# Patient Record
Sex: Female | Born: 1960 | Race: White | Hispanic: No | Marital: Married | Smoking: Never smoker
Health system: Southern US, Community
[De-identification: ages and names within clinical notes are randomized; demographics above are authoritative.]

## PROBLEM LIST (undated history)

## (undated) DIAGNOSIS — C801 Malignant (primary) neoplasm, unspecified: Secondary | ICD-10-CM

## (undated) DIAGNOSIS — I10 Essential (primary) hypertension: Secondary | ICD-10-CM

## (undated) HISTORY — DX: Essential (primary) hypertension: I10

## (undated) HISTORY — PX: CYST REMOVAL TRUNK: SHX6283

## (undated) HISTORY — DX: Malignant (primary) neoplasm, unspecified: C80.1

---

## 2000-04-12 HISTORY — PX: MOHS SURGERY: SUR867

## 2000-04-12 HISTORY — PX: BREAST CYST ASPIRATION: SHX578

## 2007-10-05 ENCOUNTER — Ambulatory Visit: Payer: Self-pay | Admitting: Obstetrics and Gynecology

## 2007-10-20 ENCOUNTER — Ambulatory Visit: Payer: Self-pay | Admitting: Obstetrics and Gynecology

## 2008-04-01 ENCOUNTER — Ambulatory Visit: Payer: Self-pay | Admitting: Obstetrics and Gynecology

## 2008-04-22 ENCOUNTER — Ambulatory Visit: Payer: Self-pay | Admitting: Obstetrics and Gynecology

## 2014-11-13 ENCOUNTER — Encounter: Payer: Self-pay | Admitting: Unknown Physician Specialty

## 2014-11-13 ENCOUNTER — Ambulatory Visit (INDEPENDENT_AMBULATORY_CARE_PROVIDER_SITE_OTHER): Payer: BLUE CROSS/BLUE SHIELD | Admitting: Unknown Physician Specialty

## 2014-11-13 VITALS — BP 176/100 | HR 85 | Temp 98.5°F | Ht 63.0 in | Wt 186.4 lb

## 2014-11-13 DIAGNOSIS — E669 Obesity, unspecified: Secondary | ICD-10-CM | POA: Insufficient documentation

## 2014-11-13 DIAGNOSIS — I10 Essential (primary) hypertension: Secondary | ICD-10-CM | POA: Diagnosis not present

## 2014-11-13 NOTE — Assessment & Plan Note (Signed)
This seems to be new problem.  Really nervous today.  Will buy a BP cuff and record readings at home.  Recheck in one month with readings.

## 2014-11-13 NOTE — Progress Notes (Signed)
   BP 176/100 mmHg  Pulse 85  Temp(Src) 98.5 F (36.9 C)  Ht 5\' 3"  (1.6 m)  Wt 186 lb 6.4 oz (84.55 kg)  BMI 33.03 kg/m2  SpO2 96%  LMP 10/16/2014 (Exact Date)   Subjective:    Patient ID: [name], female    DOB: Jan 19, 1961, 54 y.o.   MRN: [id number]  HPI: [name] is a 54 y.o. female  Chief Complaint  Patient presents with  . Establish Care   Here to establish as a patient.  Last week went for mammogram and gyn exam at [place] know what her BP is.    Relevant past medical, surgical, family and social history reviewed and updated as indicated. Interim medical history since our last visit reviewed. Allergies and medications reviewed and updated.  Review of Systems  Constitutional: Negative.   HENT: Negative.   Eyes: Negative.   Respiratory: Negative.   Cardiovascular: Negative.   Gastrointestinal: Negative.   Endocrine: Negative.   Genitourinary: Negative.   Musculoskeletal: Negative.   Skin: Negative.   Allergic/Immunologic: Negative.   Neurological: Negative.   Hematological: Negative.   Psychiatric/Behavioral: Negative.     Per HPI unless specifically indicated above     Objective:    BP 176/100 mmHg  Pulse 85  Temp(Src) 98.5 F (36.9 C)  Ht 5\' 3"  (1.6 m)  Wt 186 lb 6.4 oz (84.55 kg)  BMI 33.03 kg/m2  SpO2 96%  LMP 10/16/2014 (Exact Date)  Wt Readings from Last 3 Encounters:  11/13/14 186 lb 6.4 oz (84.55 kg)    Physical Exam  Constitutional: She is oriented to person, place, and time. She appears well-developed and well-nourished. No distress.  HENT:  Head: Normocephalic and atraumatic.  Eyes: Conjunctivae and lids are normal. Right eye exhibits no discharge. Left eye exhibits no discharge. No scleral icterus.  Cardiovascular: Normal rate and regular rhythm.   Pulmonary/Chest: Effort normal. No respiratory distress.  Abdominal: Normal appearance and bowel sounds are normal. She exhibits no distension. There is no  splenomegaly or hepatomegaly. There is no tenderness.  Musculoskeletal: Normal range of motion.  Neurological: She is alert and oriented to person, place, and time.  Skin: Skin is intact. No rash noted. No pallor.  Psychiatric: She has a normal mood and affect. Her behavior is normal. Judgment and thought content normal.    No results found for this or any previous visit.    Assessment & Plan:   Problem List Items Addressed This Visit      Unprioritized   Essential hypertension, benign - Primary    This seems to be new problem.  Really nervous today.  Will buy a BP cuff and record readings at home.  Recheck in one month with readings.        Obesity    Exercise plan discussed.  Will not exercise unless BP to goal.            Follow up plan: Return in about 4 weeks (around 12/11/2014).

## 2014-11-13 NOTE — Assessment & Plan Note (Signed)
Exercise plan discussed.  Will not exercise unless BP to goal.

## 2014-11-13 NOTE — Patient Instructions (Addendum)
Think you're too busy to work out? We have the workout for you. In minutes, high-intensity interval training (H.I.I.T.) will have you sweating, breathing hard and maximizing the health benefits of exercise without the time commitment. Best of all, it's scientifically proven to work.  What Is H.I.I.T.? SHORT WORKOUTS 101 High-intensity interval training - referred to as H.I.I.T. - is based on the idea that short bursts of strenuous exercise can have a big impact on the body. If moderate exercise - like a 20-minute jog - is good for your heart, lungs and metabolism, H.I.I.T. packs the benefits of that workout and more into a few minutes. It may sound too good to be true, but learning this exercise technique and adapting it to your life can mean saving hours at the gym. If you think you don't have time to exercise, H.I.I.T. may be the workout for you.  You can try it with any aerobic activity you like. The principles of H.I.I.T. can be applied to running, biking, stair climbing, swimming, jumping rope, rowing, even hopping or skipping. (Yes, skipping!)  The downside? Even though H.I.I.T. lasts only minutes, the workouts are tough, requiring you to push your body near its limit.  HOW INTENSE IS HIGH INTENSITY? High-intensity exercise is obviously not a casual stroll down the street, but it's not a run-till-your-lungs-pop explosion, either. Think breathless, not winded. Heart-pounding, not exploding. Legs pumping, but not uncontrolled.  You don't need any fancy heart rate monitors to do these workouts. Use cues from your body as a guide. In the middle of a high-intensity workout you should be able to say single words, but not complete whole sentences. So, if you can keep chatting to your workout partner during this workout, pump it up a few notches.  01-29-29 Training This simple program will help you make the most of a short workout by improving heart health and endurance. Try it with your favorite  cardiovascular activity. The essentials of 01-29-29 training are simple. Run, ride or perhaps row on a rowing machine gently for 30 seconds, accelerate to a moderate pace for 20 seconds, then sprint as hard as you can for 10 seconds. (It should be called 30-20-10 training, obviously, but that is not as catchy.) Repeat.  You don't even need a stopwatch to monitor the 30-, 20-, and 10-second time changes. You can just count to yourself, which seems to make the intervals pass more quickly.  Best of all? The grueling, all-out portion of the workout lasts for only 10 seconds. C'mon, you can do anything for 10 seconds, right?  Got 10 Minutes? A solitary minute of hard work buried in 10 minutes of activity can make a big difference.  The 10-Minute Workout If you like to run, bike, row or swim - just a little bit - this workout is a great option for you. Step 1 Warm up for 2 minutes Step 2 Pedal, run or swim all-out for 20 seconds. Repeat 2 more times Warm down for 3 minutes    GET STARTED To benefit the most from really, really short workouts, you need to build the habit of doing them into your hectic life. Ideally, you'll complete the workout three times a week. The best way to build that habit is to start small and be willing to tweak your schedule where you can to accommodate your new workout.  First set up a spot in your house for your workout, equipped with whatever you need to get the job done: sneakers, a  chair, a towel, etc. Then slot your workout in before you would normally shower. (You can even do it in the bathroom.) Or wake up five minutes earlier and do it first thing in the morning, so you can head off to work feeling accomplished. Or do it during your lunch hour. Run up your office's stairs or grab a private conference room for just a few minutes. Or work it into your commute. If you walk or bike to work, add some heavy intervals on the way home.  GET A BOOST FROM MUSIC Creating a  workout playlist of high-energy tunes you love will not make your workout feel easier, but it may cause you to exercise harder without even realizing it. Best of all, if you are doing a really short workout, you need only one or two great tunes to get you through. If you are willing to try something a bit different, make your own music as you exercise. Sing, hum, clap your hands, whatever you can do to jam along to your playlist. It may give you an extra boost to finish strong.  Find a song or podcast that's the length of your really, really short workout. By the time the song is over, you're done.  Excerpted from the [place] Well column http://www.nytimes.com/well/guides/really-really-short-workouts?smid=fb-nytwell&smtyp=pay   DASH Eating Plan DASH stands for "Dietary Approaches to Stop Hypertension." The DASH eating plan is a healthy eating plan that has been shown to reduce high blood pressure (hypertension). Additional health benefits may include reducing the risk of type 2 diabetes mellitus, heart disease, and stroke. The DASH eating plan may also help with weight loss. WHAT DO I NEED TO KNOW ABOUT THE DASH EATING PLAN? For the DASH eating plan, you will follow these general guidelines:  Choose foods with a percent daily value for sodium of less than 5% (as listed on the food label).  Use salt-free seasonings or herbs instead of table salt or sea salt.  Check with your health care provider or pharmacist before using salt substitutes.  Eat lower-sodium products, often labeled as "lower sodium" or "no salt added."  Eat fresh foods.  Eat more vegetables, fruits, and low-fat dairy products.  Choose whole grains. Look for the word "whole" as the first word in the ingredient list.  Choose fish and skinless chicken or [place] more often than red meat. Limit fish, poultry, and meat to 6 oz (170 g) each day.  Limit sweets, desserts, sugars, and sugary drinks.  Choose heart-healthy  fats.  Limit cheese to 1 oz (28 g) per day.  Eat more home-cooked food and less restaurant, buffet, and fast food.  Limit fried foods.  Cook foods using methods other than frying.  Limit canned vegetables. If you do use them, rinse them well to decrease the sodium.  When eating at a restaurant, ask that your food be prepared with less salt, or no salt if possible. WHAT FOODS CAN I EAT? Seek help from a dietitian for individual calorie needs. Grains Whole grain or whole wheat bread. Brown rice. Whole grain or whole wheat pasta. Quinoa, bulgur, and whole grain cereals. Low-sodium cereals. Corn or whole wheat flour tortillas. Whole grain cornbread. Whole grain crackers. Low-sodium crackers. Vegetables Fresh or frozen vegetables (raw, steamed, roasted, or grilled). Low-sodium or reduced-sodium tomato and vegetable juices. Low-sodium or reduced-sodium tomato sauce and paste. Low-sodium or reduced-sodium canned vegetables.  Fruits All fresh, canned (in natural juice), or frozen fruits. Meat and Other Protein Products Ground beef (85% or  leaner), grass-fed beef, or beef trimmed of fat. Skinless chicken or [place]. Ground chicken or [place]. Pork trimmed of fat. All fish and seafood. Eggs. Dried beans, peas, or lentils. Unsalted nuts and seeds. Unsalted canned beans. Dairy Low-fat dairy products, such as skim or 1% milk, 2% or reduced-fat cheeses, low-fat ricotta or cottage cheese, or plain low-fat yogurt. Low-sodium or reduced-sodium cheeses. Fats and Oils Tub margarines without trans fats. Light or reduced-fat mayonnaise and salad dressings (reduced sodium). Avocado. Safflower, olive, or canola oils. Natural peanut or almond butter. Other Unsalted popcorn and pretzels. The items listed above may not be a complete list of recommended foods or beverages. Contact your dietitian for more options. WHAT FOODS ARE NOT RECOMMENDED? Grains White bread. White pasta. White rice. Refined cornbread.  Bagels and croissants. Crackers that contain trans fat. Vegetables Creamed or fried vegetables. Vegetables in a cheese sauce. Regular canned vegetables. Regular canned tomato sauce and paste. Regular tomato and vegetable juices. Fruits Dried fruits. Canned fruit in light or heavy syrup. Fruit juice. Meat and Other Protein Products Fatty cuts of meat. Ribs, chicken wings, bacon, sausage, bologna, salami, chitterlings, fatback, hot dogs, bratwurst, and packaged luncheon meats. Salted nuts and seeds. Canned beans with salt. Dairy Whole or 2% milk, cream, half-and-half, and cream cheese. Whole-fat or sweetened yogurt. Full-fat cheeses or blue cheese. Nondairy creamers and whipped toppings. Processed cheese, cheese spreads, or cheese curds. Condiments Onion and garlic salt, seasoned salt, table salt, and sea salt. Canned and packaged gravies. Worcestershire sauce. Tartar sauce. Barbecue sauce. Teriyaki sauce. Soy sauce, including reduced sodium. Steak sauce. Fish sauce. Oyster sauce. Cocktail sauce. Horseradish. Ketchup and mustard. Meat flavorings and tenderizers. Bouillon cubes. Hot sauce. Tabasco sauce. Marinades. Taco seasonings. Relishes. Fats and Oils Butter, stick margarine, lard, shortening, ghee, and bacon fat. Coconut, palm kernel, or palm oils. Regular salad dressings. Other Pickles and olives. Salted popcorn and pretzels. The items listed above may not be a complete list of foods and beverages to avoid. Contact your dietitian for more information. WHERE CAN I FIND MORE INFORMATION? National Heart, Lung, and Blood Institute: travelstabloid.com Document Released: 03/18/2011 Document Revised: 08/13/2013 Document Reviewed: 01/31/2013 [facility] Patient Information 2015 [place], [place]. This information is not intended to replace advice given to you by your health care provider. Make sure you discuss any questions you have with your health care provider.   BP  goal is below 140/90.  Check at home at different times of the day.   Recheck in one month with reading.  Bring in biometric screening.  Do not start an exercise plan without your BP to goal

## 2014-11-14 NOTE — Addendum Note (Signed)
Addended by: Moss Mc on: 11/14/2014 10:34 AM   Modules accepted: Orders

## 2014-12-13 ENCOUNTER — Ambulatory Visit: Payer: BLUE CROSS/BLUE SHIELD | Admitting: Unknown Physician Specialty

## 2015-03-03 ENCOUNTER — Encounter: Payer: Self-pay | Admitting: Unknown Physician Specialty

## 2015-11-19 ENCOUNTER — Ambulatory Visit (INDEPENDENT_AMBULATORY_CARE_PROVIDER_SITE_OTHER): Payer: BLUE CROSS/BLUE SHIELD | Admitting: Family

## 2015-11-19 ENCOUNTER — Encounter (INDEPENDENT_AMBULATORY_CARE_PROVIDER_SITE_OTHER): Payer: Self-pay

## 2015-11-19 ENCOUNTER — Encounter: Payer: Self-pay | Admitting: Family

## 2015-11-19 VITALS — BP 152/98 | HR 81 | Temp 98.1°F | Wt 177.4 lb

## 2015-11-19 DIAGNOSIS — R030 Elevated blood-pressure reading, without diagnosis of hypertension: Secondary | ICD-10-CM

## 2015-11-19 DIAGNOSIS — Z0000 Encounter for general adult medical examination without abnormal findings: Secondary | ICD-10-CM | POA: Insufficient documentation

## 2015-11-19 DIAGNOSIS — IMO0001 Reserved for inherently not codable concepts without codable children: Secondary | ICD-10-CM

## 2015-11-19 NOTE — Assessment & Plan Note (Addendum)
Patient politely declines colorectal screening, clinical breast exam, hepatitis and HIV screening. I have given her information on Cologuard and patient will contact our office if she is interested in this test. Patient is due for mammogram and she would like to schedule this herself. Information given. Otherwise she follows at [place] where her last Pap smear was done .She also had basic labs done recently, due to her husband's insurance, and politely declines any further screening labs today. We discussed white coat syndrome versus essential hypertension, and patient will return for nurse's visit for BP check. Asymptomatic. We also discussed weight management and a low-cholesterol diet as her cholesterol panel revealed slightly elevated total cholesterol and LDL cholesterol. She would like to follow and treat with lifestyle modifications at this time.

## 2015-11-19 NOTE — Progress Notes (Signed)
Subjective:    Patient ID: [name], female    DOB: September 30, 1960, 55 y.o.   MRN: [id number]  CC: [name] is a 55 y.o. female who presents today for follow up.   HPI: Patient here to establish care. Has had some intermittent care at [facility], we are requesting records.   Colorectal Cancer Screening:Has never had; politely declines. Cologuard information given to patient and she will let us know if interested. Breast Cancer Screening: Due, Ordered today. Patient to schedule as preferred. Cervical Cancer Screening: Not Due. Last 2015. H/o of abnormal pap; had colposcopy which came back normal. Follows with [facility].    Immunizations       Tetanus - last 2013.  Hepatitis C screening - Candidate for; politely declined HIV Screening- Candidate for; politely declined. Labs: Declined. Exercise: Gets regular exercise.  Alcohol use: Occasional.  Smoking/tobacco use: Nonsmoker.  Regular dental exams: In need of dental exam. Wears seat belt: Yes.   Reviewed lab work the patient brought in from July of this year. Total Cholesterol 215, HDL 56,LDL 136.  Hemoglobin A1c 5.2     HISTORY:  Past Medical History:  Diagnosis Date  . Cancer ([place])    skin  . Hypertension    readings   Past Surgical History:  Procedure Laterality Date  . CYST REMOVAL TRUNK    . MOHS SURGERY  2002   Family History  Problem Relation Age of Onset  . Hypertension Mother   . Cancer Mother 87    bladder  . Hepatitis C Mother   . Cancer Father 42    bladder  . Stroke Maternal Grandmother   . Hypertension Maternal Grandmother     Allergies: Review of patient's allergies indicates not on file. No current outpatient prescriptions on file prior to visit.   No current facility-administered medications on file prior to visit.     Social History  Substance Use Topics  . Smoking status: Never Smoker  . Smokeless tobacco: Never Used  . Alcohol use 1.2 oz/week    2 Glasses of wine  per week    Review of Systems  Constitutional: Negative for chills, fever and unexpected weight change.  HENT: Negative for congestion.   Respiratory: Negative for cough.   Cardiovascular: Negative for chest pain, palpitations and leg swelling.  Gastrointestinal: Negative for nausea and vomiting.  Musculoskeletal: Negative for arthralgias and myalgias.  Skin: Negative for rash.  Neurological: Negative for headaches.  Hematological: Negative for adenopathy.  Psychiatric/Behavioral: Negative for confusion.      Objective:    BP (!) 152/98 (BP Location: Left Arm, Patient Position: Sitting, Cuff Size: Large)   Pulse 81   Temp 98.1 F (36.7 C) (Oral)   Wt 177 lb 6.4 oz (80.5 kg)   SpO2 96%   BMI 31.42 kg/m  BP Readings from Last 3 Encounters:  11/19/15 (!) 152/98  11/13/14 (!) 176/100   Wt Readings from Last 3 Encounters:  11/19/15 177 lb 6.4 oz (80.5 kg)  11/13/14 186 lb 6.4 oz (84.6 kg)    Physical Exam  Constitutional: She appears well-developed and well-nourished.  Eyes: Conjunctivae are normal.  Cardiovascular: Normal rate, regular rhythm, normal heart sounds and normal pulses.   Pulmonary/Chest: Effort normal and breath sounds normal. She has no wheezes. She has no rhonchi. She has no rales.  Neurological: She is alert.  Skin: Skin is warm and dry.  Psychiatric: She has a normal mood and affect. Her speech is normal and  behavior is normal. Thought content normal.  Vitals reviewed.      Assessment & Plan:   Problem List Items Addressed This Visit      Other   Elevated BP    Patient believes white coat syndrome. Will have nurse check BP next week. Will follow.       Routine physical examination - Primary    Patient politely declines colorectal screening, clinical breast exam, hepatitis and HIV screening. I have given her information on Cologuard and patient will contact our office if she is interested in this test. Patient is due for mammogram and she would  like to schedule this herself. Information given. Otherwise she follows at [place] where her last Pap smear was done .She also had basic labs done recently, due to her husband's insurance, and politely declines any further screening labs today. We discussed white coat syndrome versus essential hypertension, and patient will return for nurse's visit for BP check. Asymptomatic. We also discussed weight management and a low-cholesterol diet as her cholesterol panel revealed slightly elevated total cholesterol and LDL cholesterol. She would like to follow and treat with lifestyle modifications at this time.       Relevant Orders   MM DIGITAL SCREENING BILATERAL    Other Visit Diagnoses   None.      [name] does not currently have medications on file.   No orders of the defined types were placed in this encounter.   Return precautions given.   Risks, benefits, and alternatives of the medications and treatment plan prescribed today were discussed, and patient expressed understanding.   Education regarding symptom management and diagnosis given to patient on AVS.  Continue to follow with Mable Paris, FNP for routine health maintenance.   Jodi Geralds and I agreed with plan.   Mable Paris, FNP

## 2015-11-19 NOTE — Patient Instructions (Signed)
It is my pleasure meeting you today. As we discussed, I would like to watch your blood pressure more closely,  please come back in one week for nurse's visit to check blood pressure. You may make this appointment at checkout.    Health Maintenance, Female Adopting a healthy lifestyle and getting preventive care can go a long way to promote health and wellness. Talk with your health care provider about what schedule of regular examinations is right for you. This is a good chance for you to check in with your provider about disease prevention and staying healthy. In between checkups, there are plenty of things you can do on your own. Experts have done a lot of research about which lifestyle changes and preventive measures are most likely to keep you healthy. Ask your health care provider for more information. WEIGHT AND DIET  Eat a healthy diet  Be sure to include plenty of vegetables, fruits, low-fat dairy products, and lean protein.  Do not eat a lot of foods high in solid fats, added sugars, or salt.  Get regular exercise. This is one of the most important things you can do for your health.  Most adults should exercise for at least 150 minutes each week. The exercise should increase your heart rate and make you sweat (moderate-intensity exercise).  Most adults should also do strengthening exercises at least twice a week. This is in addition to the moderate-intensity exercise.  Maintain a healthy weight  Body mass index (BMI) is a measurement that can be used to identify possible weight problems. It estimates body fat based on height and weight. Your health care provider can help determine your BMI and help you achieve or maintain a healthy weight.  For females 67 years of age and older:   A BMI below 18.5 is considered underweight.  A BMI of 18.5 to 24.9 is normal.  A BMI of 25 to 29.9 is considered overweight.  A BMI of 30 and above is considered obese.  Watch levels of  cholesterol and blood lipids  You should start having your blood tested for lipids and cholesterol at 55 years of age, then have this test every 5 years.  You may need to have your cholesterol levels checked more often if:  Your lipid or cholesterol levels are high.  You are older than 55 years of age.  You are at high risk for heart disease.  CANCER SCREENING   Lung Cancer  Lung cancer screening is recommended for adults 61-31 years old who are at high risk for lung cancer because of a history of smoking.  A yearly low-dose CT scan of the lungs is recommended for people who:  Currently smoke.  Have quit within the past 15 years.  Have at least a 30-pack-year history of smoking. A pack year is smoking an average of one pack of cigarettes a day for 1 year.  Yearly screening should continue until it has been 15 years since you quit.  Yearly screening should stop if you develop a health problem that would prevent you from having lung cancer treatment.  Breast Cancer  Practice breast self-awareness. This means understanding how your breasts normally appear and feel.  It also means doing regular breast self-exams. Let your health care provider know about any changes, no matter how small.  If you are in your 20s or 30s, you should have a clinical breast exam (CBE) by a health care provider every 1-3 years as part of a regular health  exam.  If you are 40 or older, have a CBE every year. Also consider having a breast X-ray (mammogram) every year.  If you have a family history of breast cancer, talk to your health care provider about genetic screening.  If you are at high risk for breast cancer, talk to your health care provider about having an MRI and a mammogram every year.  Breast cancer gene (BRCA) assessment is recommended for women who have family members with BRCA-related cancers. BRCA-related cancers include:  Breast.  Ovarian.  Tubal.  Peritoneal  cancers.  Results of the assessment will determine the need for genetic counseling and BRCA1 and BRCA2 testing. Cervical Cancer Your health care provider may recommend that you be screened regularly for cancer of the pelvic organs (ovaries, uterus, and vagina). This screening involves a pelvic examination, including checking for microscopic changes to the surface of your cervix (Pap test). You may be encouraged to have this screening done every 3 years, beginning at age 41.  For women ages 38-65, health care providers may recommend pelvic exams and Pap testing every 3 years, or they may recommend the Pap and pelvic exam, combined with testing for human papilloma virus (HPV), every 5 years. Some types of HPV increase your risk of cervical cancer. Testing for HPV may also be done on women of any age with unclear Pap test results.  Other health care providers may not recommend any screening for nonpregnant women who are considered low risk for pelvic cancer and who do not have symptoms. Ask your health care provider if a screening pelvic exam is right for you.  If you have had past treatment for cervical cancer or a condition that could lead to cancer, you need Pap tests and screening for cancer for at least 20 years after your treatment. If Pap tests have been discontinued, your risk factors (such as having a new sexual partner) need to be reassessed to determine if screening should resume. Some women have medical problems that increase the chance of getting cervical cancer. In these cases, your health care provider may recommend more frequent screening and Pap tests. Colorectal Cancer  This type of cancer can be detected and often prevented.  Routine colorectal cancer screening usually begins at 55 years of age and continues through 55 years of age.  Your health care provider may recommend screening at an earlier age if you have risk factors for colon cancer.  Your health care provider may also  recommend using home test kits to check for hidden blood in the stool.  A small camera at the end of a tube can be used to examine your colon directly (sigmoidoscopy or colonoscopy). This is done to check for the earliest forms of colorectal cancer.  Routine screening usually begins at age 10.  Direct examination of the colon should be repeated every 5-10 years through 55 years of age. However, you may need to be screened more often if early forms of precancerous polyps or small growths are found. Skin Cancer  Check your skin from head to toe regularly.  Tell your health care provider about any new moles or changes in moles, especially if there is a change in a mole's shape or color.  Also tell your health care provider if you have a mole that is larger than the size of a pencil eraser.  Always use sunscreen. Apply sunscreen liberally and repeatedly throughout the day.  Protect yourself by wearing long sleeves, pants, a wide-brimmed hat, and sunglasses  whenever you are outside. HEART DISEASE, DIABETES, AND HIGH BLOOD PRESSURE   High blood pressure causes heart disease and increases the risk of stroke. High blood pressure is more likely to develop in:  People who have blood pressure in the high end of the normal range (130-139/85-89 mm Hg).  People who are overweight or obese.  People who are African American.  If you are 47-41 years of age, have your blood pressure checked every 3-5 years. If you are 33 years of age or older, have your blood pressure checked every year. You should have your blood pressure measured twice--once when you are at a hospital or clinic, and once when you are not at a hospital or clinic. Record the average of the two measurements. To check your blood pressure when you are not at a hospital or clinic, you can use:  An automated blood pressure machine at a pharmacy.  A home blood pressure monitor.  If you are between 17 years and 73 years old, ask your health  care provider if you should take aspirin to prevent strokes.  Have regular diabetes screenings. This involves taking a blood sample to check your fasting blood sugar level.  If you are at a normal weight and have a low risk for diabetes, have this test once every three years after 55 years of age.  If you are overweight and have a high risk for diabetes, consider being tested at a younger age or more often. PREVENTING INFECTION  Hepatitis B  If you have a higher risk for hepatitis B, you should be screened for this virus. You are considered at high risk for hepatitis B if:  You were born in a country where hepatitis B is common. Ask your health care provider which countries are considered high risk.  Your parents were born in a high-risk country, and you have not been immunized against hepatitis B (hepatitis B vaccine).  You have HIV or AIDS.  You use needles to inject street drugs.  You live with someone who has hepatitis B.  You have had sex with someone who has hepatitis B.  You get hemodialysis treatment.  You take certain medicines for conditions, including cancer, organ transplantation, and autoimmune conditions. Hepatitis C  Blood testing is recommended for:  Everyone born from [birth year] through 12/22/1963.  Anyone with known risk factors for hepatitis C. Sexually transmitted infections (STIs)  You should be screened for sexually transmitted infections (STIs) including gonorrhea and chlamydia if:  You are sexually active and are younger than 55 years of age.  You are older than 55 years of age and your health care provider tells you that you are at risk for this type of infection.  Your sexual activity has changed since you were last screened and you are at an increased risk for chlamydia or gonorrhea. Ask your health care provider if you are at risk.  If you do not have HIV, but are at risk, it may be recommended that you take a prescription medicine daily to prevent HIV  infection. This is called pre-exposure prophylaxis (PrEP). You are considered at risk if:  You are sexually active and do not regularly use condoms or know the HIV status of your partner(s).  You take drugs by injection.  You are sexually active with a partner who has HIV. Talk with your health care provider about whether you are at high risk of being infected with HIV. If you choose to begin PrEP, you should first be  tested for HIV. You should then be tested every 3 months for as long as you are taking PrEP.  PREGNANCY   If you are premenopausal and you may become pregnant, ask your health care provider about preconception counseling.  If you may become pregnant, take 400 to 800 micrograms (mcg) of folic acid every day.  If you want to prevent pregnancy, talk to your health care provider about birth control (contraception). OSTEOPOROSIS AND MENOPAUSE   Osteoporosis is a disease in which the bones lose minerals and strength with aging. This can result in serious bone fractures. Your risk for osteoporosis can be identified using a bone density scan.  If you are 25 years of age or older, or if you are at risk for osteoporosis and fractures, ask your health care provider if you should be screened.  Ask your health care provider whether you should take a calcium or vitamin D supplement to lower your risk for osteoporosis.  Menopause may have certain physical symptoms and risks.  Hormone replacement therapy may reduce some of these symptoms and risks. Talk to your health care provider about whether hormone replacement therapy is right for you.  HOME CARE INSTRUCTIONS   Schedule regular health, dental, and eye exams.  Stay current with your immunizations.   Do not use any tobacco products including cigarettes, chewing tobacco, or electronic cigarettes.  If you are pregnant, do not drink alcohol.  If you are breastfeeding, limit how much and how often you drink alcohol.  Limit  alcohol intake to no more than 1 drink per day for nonpregnant women. One drink equals 12 ounces of beer, 5 ounces of wine, or 1 ounces of hard liquor.  Do not use street drugs.  Do not share needles.  Ask your health care provider for help if you need support or information about quitting drugs.  Tell your health care provider if you often feel depressed.  Tell your health care provider if you have ever been abused or do not feel safe at home.   This information is not intended to replace advice given to you by your health care provider. Make sure you discuss any questions you have with your health care provider.   Document Released: 10/12/2010 Document Revised: 04/19/2014 Document Reviewed: 02/28/2013 Elsevier Interactive Patient Education [employer].

## 2015-11-19 NOTE — Progress Notes (Signed)
Pre visit review using our clinic review tool, if applicable. No additional management support is needed unless otherwise documented below in the visit note. 

## 2015-11-19 NOTE — Assessment & Plan Note (Signed)
Patient believes white coat syndrome. Will have nurse check BP next week. Will follow.

## 2016-01-30 ENCOUNTER — Telehealth: Payer: Self-pay

## 2016-01-30 DIAGNOSIS — Z1231 Encounter for screening mammogram for malignant neoplasm of breast: Secondary | ICD-10-CM

## 2016-01-30 NOTE — Telephone Encounter (Signed)
-----   Message from Eustace Pen sent at 01/27/2016  2:40 PM EDT ----- Regarding: mammogram Griselda Miner, Can you change this pt's mammogram to a bilateral diagnostic with a bilateral ultrasound? She had a CAT 4 in 2010 and she did not follow up with anyone because of it. Thanks, [employer]

## 2016-01-30 NOTE — Telephone Encounter (Signed)
Orders have been replaced.

## 2016-02-02 ENCOUNTER — Encounter: Payer: Self-pay | Admitting: Family

## 2016-02-23 ENCOUNTER — Other Ambulatory Visit: Payer: BLUE CROSS/BLUE SHIELD

## 2016-02-23 ENCOUNTER — Ambulatory Visit: Payer: BLUE CROSS/BLUE SHIELD

## 2016-04-16 ENCOUNTER — Telehealth: Payer: Self-pay | Admitting: *Deleted

## 2016-04-16 DIAGNOSIS — Z20828 Contact with and (suspected) exposure to other viral communicable diseases: Secondary | ICD-10-CM

## 2016-04-16 MED ORDER — OSELTAMIVIR PHOSPHATE 75 MG PO CAPS: 75.0000 mg | ORAL_CAPSULE | Freq: Every day | ORAL | 0 refills | Status: DC

## 2016-04-16 NOTE — Telephone Encounter (Signed)
Patient has been exposed to the flu and has requested to have tamiflu called into the pharmacy for preventive care due to her having to watch her grandchildren Pt contact  [place]

## 2016-04-16 NOTE — Telephone Encounter (Signed)
Patient has been informed that medication was sent in.

## 2016-04-16 NOTE — Telephone Encounter (Signed)
Let pt know done

## 2016-04-16 NOTE — Telephone Encounter (Signed)
Please advise 

## 2016-04-16 NOTE — Telephone Encounter (Signed)
Please call pt at [phone] if no answer

## 2016-04-19 ENCOUNTER — Telehealth: Payer: Self-pay | Admitting: Family

## 2016-04-19 DIAGNOSIS — Z20828 Contact with and (suspected) exposure to other viral communicable diseases: Secondary | ICD-10-CM

## 2016-04-19 MED ORDER — OSELTAMIVIR PHOSPHATE 75 MG PO CAPS: 75.0000 mg | ORAL_CAPSULE | Freq: Every day | ORAL | 0 refills | Status: DC

## 2016-04-19 NOTE — Telephone Encounter (Signed)
Ordered on 1/5 Will reorder  Call pt and let her know we are sorry and that we sent it over 3 days ago; we faxed again today

## 2016-04-19 NOTE — Telephone Encounter (Signed)
Are you able to place order. I do not understand why I was unable to do so on the [id number]. Please advise.

## 2016-04-19 NOTE — Telephone Encounter (Signed)
Pt called and stated that the pharamcy did not receive rx for oseltamivir (TAMIFLU) 75 MG capsule. Please advise, thank you!  Pharmacy - [id number] - [place], [address]  Call pt @ [phone]

## 2016-10-01 ENCOUNTER — Other Ambulatory Visit: Payer: Self-pay | Admitting: *Deleted

## 2016-10-01 ENCOUNTER — Inpatient Hospital Stay
Admission: RE | Admit: 2016-10-01 | Discharge: 2016-10-01 | Disposition: A | Discharge status: Home / Self Care | Payer: Self-pay | Source: Ambulatory Visit | Attending: *Deleted | Admitting: *Deleted

## 2016-10-01 DIAGNOSIS — Z9289 Personal history of other medical treatment: Secondary | ICD-10-CM

## 2016-10-05 ENCOUNTER — Encounter: Payer: BLUE CROSS/BLUE SHIELD | Admitting: Family

## 2016-10-28 ENCOUNTER — Ambulatory Visit (INDEPENDENT_AMBULATORY_CARE_PROVIDER_SITE_OTHER): Payer: Commercial Managed Care - PPO | Admitting: Family

## 2016-10-28 ENCOUNTER — Encounter: Payer: Self-pay | Admitting: Family

## 2016-10-28 VITALS — BP 146/108 | HR 78 | Temp 98.2°F | Ht 63.0 in | Wt 169.2 lb

## 2016-10-28 DIAGNOSIS — N6320 Unspecified lump in the left breast, unspecified quadrant: Secondary | ICD-10-CM

## 2016-10-28 DIAGNOSIS — Z8052 Family history of malignant neoplasm of bladder: Secondary | ICD-10-CM | POA: Diagnosis not present

## 2016-10-28 DIAGNOSIS — Z0001 Encounter for general adult medical examination with abnormal findings: Secondary | ICD-10-CM

## 2016-10-28 DIAGNOSIS — I10 Essential (primary) hypertension: Secondary | ICD-10-CM | POA: Diagnosis not present

## 2016-10-28 DIAGNOSIS — Z0000 Encounter for general adult medical examination without abnormal findings: Secondary | ICD-10-CM

## 2016-10-28 LAB — URINALYSIS, ROUTINE W REFLEX MICROSCOPIC
BILIRUBIN URINE: NEGATIVE
KETONES UR: NEGATIVE
NITRITE: NEGATIVE
Specific Gravity, Urine: 1.01 (ref 1.000–1.030)
Total Protein, Urine: NEGATIVE
URINE GLUCOSE: NEGATIVE
Urobilinogen, UA: 0.2 (ref 0.0–1.0)
pH: 6.5 (ref 5.0–8.0)

## 2016-10-28 MED ORDER — AMLODIPINE BESYLATE 2.5 MG PO TABS: 2.5000 mg | ORAL_TABLET | Freq: Every day | ORAL | 3 refills | Status: DC

## 2016-10-28 NOTE — Progress Notes (Signed)
Subjective:    Patient ID: [name], female    DOB: 01/29/1961, 56 y.o.   MRN: [id number]  CC: [name] is a 56 y.o. female who presents today for physical exam.    HPI: HTN- at home has been 146/79 to 168 SBP. Denies exertional chest pain or pressure, numbness or tingling radiating to left arm or jaw, palpitations, dizziness, frequent headaches, changes in vision, or shortness of breath.      Colorectal Cancer Screening: Due; declines at this time Breast Cancer Screening: Mammogram scheduled next week.  Cervical Cancer Screening: due; follows [facility].  Bone Health screening/DEXA for 65+: No increased fracture risk. Defer screening at this time. Lung Cancer Screening: Doesn't have 30 year pack year history and age > 28 years       Tetanus - UTD         Hepatitis C screening - Candidate for, declines HIV Screening- Candidate for, declines Labs: Screening labs done with work;  Exercise: Gets regular exercise.  Alcohol use: Occasional. Smoking/tobacco use: Nonsmoker.  Regular dental exams: In need of dental exam. Wears seat belt: Yes. Skin: h/o basal, squamous; no dermatologist at this point  HISTORY:  Past Medical History:  Diagnosis Date  . Cancer ([place])    skin  . Hypertension    readings    Past Surgical History:  Procedure Laterality Date  . CYST REMOVAL TRUNK    . MOHS SURGERY  2002   Family History  Problem Relation Age of Onset  . Hypertension Mother   . Cancer Mother 27       bladder  . Hepatitis C Mother   . Cancer Father 35       bladder  . Stroke Maternal Grandmother   . Hypertension Maternal Grandmother   . Colon cancer Neg Hx       ALLERGIES: Patient has no allergy information on record.  No current outpatient prescriptions on file prior to visit.   No current facility-administered medications on file prior to visit.     Social History  Substance Use Topics  . Smoking status: Never Smoker  . Smokeless tobacco:  Never Used  . Alcohol use 1.2 oz/week    2 Glasses of wine per week    Review of Systems  Constitutional: Negative for chills, fever and unexpected weight change.  HENT: Negative for congestion.   Respiratory: Negative for cough.   Cardiovascular: Negative for chest pain, palpitations and leg swelling.  Gastrointestinal: Negative for nausea and vomiting.  Musculoskeletal: Negative for arthralgias and myalgias.  Skin: Negative for rash.  Neurological: Negative for headaches.  Hematological: Negative for adenopathy.  Psychiatric/Behavioral: Negative for confusion.      Objective:    BP (!) 146/108   Pulse 78   Temp 98.2 F (36.8 C) (Oral)   Ht 5\' 3"  (1.6 m)   Wt 169 lb 3.2 oz (76.7 kg)   SpO2 98%   BMI 29.97 kg/m   BP Readings from Last 3 Encounters:  10/28/16 (!) 146/108  11/19/15 (!) 152/98  11/13/14 (!) 176/100   Wt Readings from Last 3 Encounters:  10/28/16 169 lb 3.2 oz (76.7 kg)  11/19/15 177 lb 6.4 oz (80.5 kg)  11/13/14 186 lb 6.4 oz (84.6 kg)    Physical Exam  Constitutional: She appears well-developed and well-nourished.  Eyes: Conjunctivae are normal.  Neck: No thyroid mass and no thyromegaly present.  Cardiovascular: Normal rate, regular rhythm, normal heart sounds and normal pulses.  Pulmonary/Chest: Effort normal and breath sounds normal. She has no wheezes. She has no rhonchi. She has no rales. Right breast exhibits no inverted nipple, no mass, no nipple discharge, no skin change and no tenderness. Left breast exhibits mass. Left breast exhibits no inverted nipple, no nipple discharge, no skin change and no tenderness. Breasts are symmetrical.  CBE performed. Left breast mass, well circumscribed. Bilateral breasts noted to be dense.  Lymphadenopathy:       Head (right side): No submental, no submandibular, no tonsillar, no preauricular, no posterior auricular and no occipital adenopathy present.       Head (left side): No submental, no submandibular, no  tonsillar, no preauricular, no posterior auricular and no occipital adenopathy present.    She has no cervical adenopathy.       Right cervical: No superficial cervical, no deep cervical and no posterior cervical adenopathy present.      Left cervical: No superficial cervical, no deep cervical and no posterior cervical adenopathy present.    She has no axillary adenopathy.  Neurological: She is alert.  Skin: Skin is warm and dry.  Psychiatric: She has a normal mood and affect. Her speech is normal and behavior is normal. Thought content normal.  Vitals reviewed.      Assessment & Plan:   Problem List Items Addressed This Visit      Cardiovascular and Mediastinum   Essential hypertension, benign    Elevated. We'll start low-dose amlodipine and patient will follow-up in one month, sooner if not well-controlled      Relevant Medications   amLODipine (NORVASC) 2.5 MG tablet     Other   Routine physical examination - Primary    CBE performed. Pap smear done with OB/GYN. Information given on cologuard and patient will let us know. Encouraged continued exercise and also for patient to establish with dermatology. She declines referral to dermatology at this time and states she will call on her own. She will bring labs from biometric screening to office for my review      Family history of bladder cancer    Discussed risk for patient. She is nonsmoker, occasional use of alcohol. No gross hematuria. Pending urinalysis. Advised patient that from time to time, she needs to have urine checked for blood. She declines a referral to urology for a scope at this time. Advised if any blood was seen in her urine, we would make that referral.      Relevant Orders   Urinalysis, Routine w reflex microscopic   Left breast mass    Advised to have diagnostic mammogram and also left breast ultrasound. Patient will call [facility] and make this change.      Relevant Orders   MM DIAG BREAST TOMO BILATERAL     US BREAST LTD UNI LEFT INC AXILLA       I have discontinued Ms. Chowning's oseltamivir. I am also having her start on amLODipine.   Meds ordered this encounter  Medications  . amLODipine (NORVASC) 2.5 MG tablet    Sig: Take 1 tablet (2.5 mg total) by mouth daily.    Dispense:  90 tablet    Refill:  3    Order Specific Question:   Supervising Provider    Answer:   Crecencio Mc [[id number]]    Return precautions given.   Risks, benefits, and alternatives of the medications and treatment plan prescribed today were discussed, and patient expressed understanding.   Education regarding symptom management and diagnosis given to  patient on AVS.   Continue to follow with Burnard Hawthorne, FNP for routine health maintenance.   Jodi Geralds and I agreed with plan.   Mable Paris, FNP

## 2016-10-28 NOTE — Patient Instructions (Addendum)
Please make Dermatology as discussed. Let me know if you would like to have a referral.   Call [facility] about Cologuard and let me know if you would like to proceed.   Start amlopine Blood pressure goal < 130/80.  Call [facility] and let them know you need 3d DIAGNOSTIC mammogram and left breast ultrasound. Orders in system  Bring back labs.   Follow up one  Month-ish.  Health Maintenance, Female Adopting a healthy lifestyle and getting preventive care can go a long way to promote health and wellness. Talk with your health care provider about what schedule of regular examinations is right for you. This is a good chance for you to check in with your provider about disease prevention and staying healthy. In between checkups, there are plenty of things you can do on your own. Experts have done a lot of research about which lifestyle changes and preventive measures are most likely to keep you healthy. Ask your health care provider for more information. Weight and diet Eat a healthy diet  Be sure to include plenty of vegetables, fruits, low-fat dairy products, and lean protein.  Do not eat a lot of foods high in solid fats, added sugars, or salt.  Get regular exercise. This is one of the most important things you can do for your health. ? Most adults should exercise for at least 150 minutes each week. The exercise should increase your heart rate and make you sweat (moderate-intensity exercise). ? Most adults should also do strengthening exercises at least twice a week. This is in addition to the moderate-intensity exercise.  Maintain a healthy weight  Body mass index (BMI) is a measurement that can be used to identify possible weight problems. It estimates body fat based on height and weight. Your health care provider can help determine your BMI and help you achieve or maintain a healthy weight.  For females 45 years of age and older: ? A BMI below 18.5 is considered underweight. ? A BMI of 18.5  to 24.9 is normal. ? A BMI of 25 to 29.9 is considered overweight. ? A BMI of 30 and above is considered obese.  Watch levels of cholesterol and blood lipids  You should start having your blood tested for lipids and cholesterol at 56 years of age, then have this test every 5 years.  You may need to have your cholesterol levels checked more often if: ? Your lipid or cholesterol levels are high. ? You are older than 56 years of age. ? You are at high risk for heart disease.  Cancer screening Lung Cancer  Lung cancer screening is recommended for adults 22-45 years old who are at high risk for lung cancer because of a history of smoking.  A yearly low-dose CT scan of the lungs is recommended for people who: ? Currently smoke. ? Have quit within the past 15 years. ? Have at least a 30-pack-year history of smoking. A pack year is smoking an average of one pack of cigarettes a day for 1 year.  Yearly screening should continue until it has been 15 years since you quit.  Yearly screening should stop if you develop a health problem that would prevent you from having lung cancer treatment.  Breast Cancer  Practice breast self-awareness. This means understanding how your breasts normally appear and feel.  It also means doing regular breast self-exams. Let your health care provider know about any changes, no matter how small.  If you are in your 51s  or 67s, you should have a clinical breast exam (CBE) by a health care provider every 1-3 years as part of a regular health exam.  If you are 97 or older, have a CBE every year. Also consider having a breast X-ray (mammogram) every year.  If you have a family history of breast cancer, talk to your health care provider about genetic screening.  If you are at high risk for breast cancer, talk to your health care provider about having an MRI and a mammogram every year.  Breast cancer gene (BRCA) assessment is recommended for women who have family  members with BRCA-related cancers. BRCA-related cancers include: ? Breast. ? Ovarian. ? Tubal. ? Peritoneal cancers.  Results of the assessment will determine the need for genetic counseling and BRCA1 and BRCA2 testing.  Cervical Cancer Your health care provider may recommend that you be screened regularly for cancer of the pelvic organs (ovaries, uterus, and vagina). This screening involves a pelvic examination, including checking for microscopic changes to the surface of your cervix (Pap test). You may be encouraged to have this screening done every 3 years, beginning at age 29.  For women ages 9-65, health care providers may recommend pelvic exams and Pap testing every 3 years, or they may recommend the Pap and pelvic exam, combined with testing for human papilloma virus (HPV), every 5 years. Some types of HPV increase your risk of cervical cancer. Testing for HPV may also be done on women of any age with unclear Pap test results.  Other health care providers may not recommend any screening for nonpregnant women who are considered low risk for pelvic cancer and who do not have symptoms. Ask your health care provider if a screening pelvic exam is right for you.  If you have had past treatment for cervical cancer or a condition that could lead to cancer, you need Pap tests and screening for cancer for at least 20 years after your treatment. If Pap tests have been discontinued, your risk factors (such as having a new sexual partner) need to be reassessed to determine if screening should resume. Some women have medical problems that increase the chance of getting cervical cancer. In these cases, your health care provider may recommend more frequent screening and Pap tests.  Colorectal Cancer  This type of cancer can be detected and often prevented.  Routine colorectal cancer screening usually begins at 56 years of age and continues through 56 years of age.  Your health care provider may  recommend screening at an earlier age if you have risk factors for colon cancer.  Your health care provider may also recommend using home test kits to check for hidden blood in the stool.  A small camera at the end of a tube can be used to examine your colon directly (sigmoidoscopy or colonoscopy). This is done to check for the earliest forms of colorectal cancer.  Routine screening usually begins at age 60.  Direct examination of the colon should be repeated every 5-10 years through 56 years of age. However, you may need to be screened more often if early forms of precancerous polyps or small growths are found.  Skin Cancer  Check your skin from head to toe regularly.  Tell your health care provider about any new moles or changes in moles, especially if there is a change in a mole's shape or color.  Also tell your health care provider if you have a mole that is larger than the size of a  pencil eraser.  Always use sunscreen. Apply sunscreen liberally and repeatedly throughout the day.  Protect yourself by wearing long sleeves, pants, a wide-brimmed hat, and sunglasses whenever you are outside.  Heart disease, diabetes, and high blood pressure  High blood pressure causes heart disease and increases the risk of stroke. High blood pressure is more likely to develop in: ? People who have blood pressure in the high end of the normal range (130-139/85-89 mm Hg). ? People who are overweight or obese. ? People who are African American.  If you are 51-22 years of age, have your blood pressure checked every 3-5 years. If you are 68 years of age or older, have your blood pressure checked every year. You should have your blood pressure measured twice-once when you are at a hospital or clinic, and once when you are not at a hospital or clinic. Record the average of the two measurements. To check your blood pressure when you are not at a hospital or clinic, you can use: ? An automated blood pressure  machine at a pharmacy. ? A home blood pressure monitor.  If you are between 50 years and 63 years old, ask your health care provider if you should take aspirin to prevent strokes.  Have regular diabetes screenings. This involves taking a blood sample to check your fasting blood sugar level. ? If you are at a normal weight and have a low risk for diabetes, have this test once every three years after 56 years of age. ? If you are overweight and have a high risk for diabetes, consider being tested at a younger age or more often. Preventing infection Hepatitis B  If you have a higher risk for hepatitis B, you should be screened for this virus. You are considered at high risk for hepatitis B if: ? You were born in a country where hepatitis B is common. Ask your health care provider which countries are considered high risk. ? Your parents were born in a high-risk country, and you have not been immunized against hepatitis B (hepatitis B vaccine). ? You have HIV or AIDS. ? You use needles to inject street drugs. ? You live with someone who has hepatitis B. ? You have had sex with someone who has hepatitis B. ? You get hemodialysis treatment. ? You take certain medicines for conditions, including cancer, organ transplantation, and autoimmune conditions.  Hepatitis C  Blood testing is recommended for: ? Everyone born from [birth year] through 1965. ? Anyone with known risk factors for hepatitis C.  Sexually transmitted infections (STIs)  You should be screened for sexually transmitted infections (STIs) including gonorrhea and chlamydia if: ? You are sexually active and are younger than 56 years of age. ? You are older than 56 years of age and your health care provider tells you that you are at risk for this type of infection. ? Your sexual activity has changed since you were last screened and you are at an increased risk for chlamydia or gonorrhea. Ask your health care provider if you are at  risk.  If you do not have HIV, but are at risk, it may be recommended that you take a prescription medicine daily to prevent HIV infection. This is called pre-exposure prophylaxis (PrEP). You are considered at risk if: ? You are sexually active and do not regularly use condoms or know the HIV status of your partner(s). ? You take drugs by injection. ? You are sexually active with a partner who has HIV.  Talk with your health care provider about whether you are at high risk of being infected with HIV. If you choose to begin PrEP, you should first be tested for HIV. You should then be tested every 3 months for as long as you are taking PrEP. Pregnancy  If you are premenopausal and you may become pregnant, ask your health care provider about preconception counseling.  If you may become pregnant, take 400 to 800 micrograms (mcg) of folic acid every day.  If you want to prevent pregnancy, talk to your health care provider about birth control (contraception). Osteoporosis and menopause  Osteoporosis is a disease in which the bones lose minerals and strength with aging. This can result in serious bone fractures. Your risk for osteoporosis can be identified using a bone density scan.  If you are 2 years of age or older, or if you are at risk for osteoporosis and fractures, ask your health care provider if you should be screened.  Ask your health care provider whether you should take a calcium or vitamin D supplement to lower your risk for osteoporosis.  Menopause may have certain physical symptoms and risks.  Hormone replacement therapy may reduce some of these symptoms and risks. Talk to your health care provider about whether hormone replacement therapy is right for you. Follow these instructions at home:  Schedule regular health, dental, and eye exams.  Stay current with your immunizations.  Do not use any tobacco products including cigarettes, chewing tobacco, or electronic  cigarettes.  If you are pregnant, do not drink alcohol.  If you are breastfeeding, limit how much and how often you drink alcohol.  Limit alcohol intake to no more than 1 drink per day for nonpregnant women. One drink equals 12 ounces of beer, 5 ounces of wine, or 1 ounces of hard liquor.  Do not use street drugs.  Do not share needles.  Ask your health care provider for help if you need support or information about quitting drugs.  Tell your health care provider if you often feel depressed.  Tell your health care provider if you have ever been abused or do not feel safe at home. This information is not intended to replace advice given to you by your health care provider. Make sure you discuss any questions you have with your health care provider. Document Released: 10/12/2010 Document Revised: 09/04/2015 Document Reviewed: 12/31/2014 Elsevier Interactive Patient Education  [employer].

## 2016-10-28 NOTE — Assessment & Plan Note (Signed)
Advised to have diagnostic mammogram and also left breast ultrasound. Patient will call [facility] and make this change.

## 2016-10-28 NOTE — Progress Notes (Signed)
Pre visit review using our clinic review tool, if applicable. No additional management support is needed unless otherwise documented below in the visit note. 

## 2016-10-28 NOTE — Assessment & Plan Note (Signed)
Discussed risk for patient. She is nonsmoker, occasional use of alcohol. No gross hematuria. Pending urinalysis. Advised patient that from time to time, she needs to have urine checked for blood. She declines a referral to urology for a scope at this time. Advised if any blood was seen in her urine, we would make that referral.

## 2016-10-28 NOTE — Assessment & Plan Note (Addendum)
CBE performed. Pap smear done with OB/GYN. Information given on cologuard and patient will let us know. Encouraged continued exercise and also for patient to establish with dermatology. She declines referral to dermatology at this time and states she will call on her own. She will bring labs from biometric screening to office for my review

## 2016-10-28 NOTE — Assessment & Plan Note (Signed)
Elevated. We'll start low-dose amlodipine and patient will follow-up in one month, sooner if not well-controlled

## 2016-11-01 ENCOUNTER — Other Ambulatory Visit: Payer: Self-pay | Admitting: Family

## 2016-11-01 ENCOUNTER — Other Ambulatory Visit (INDEPENDENT_AMBULATORY_CARE_PROVIDER_SITE_OTHER): Payer: Commercial Managed Care - PPO

## 2016-11-01 DIAGNOSIS — Z8052 Family history of malignant neoplasm of bladder: Secondary | ICD-10-CM

## 2016-11-02 LAB — URINALYSIS, ROUTINE W REFLEX MICROSCOPIC
BILIRUBIN URINE: NEGATIVE
KETONES UR: NEGATIVE
NITRITE: NEGATIVE
PH: 6.5 (ref 5.0–8.0)
Specific Gravity, Urine: 1.015 (ref 1.000–1.030)
Total Protein, Urine: NEGATIVE
URINE GLUCOSE: NEGATIVE
UROBILINOGEN UA: 0.2 (ref 0.0–1.0)

## 2016-11-02 LAB — CULTURE, URINE COMPREHENSIVE

## 2016-11-03 ENCOUNTER — Ambulatory Visit
Admission: RE | Admit: 2016-11-03 | Discharge: 2016-11-03 | Disposition: A | Discharge status: Home / Self Care | Payer: Commercial Managed Care - PPO | Source: Ambulatory Visit | Attending: Family | Admitting: Family

## 2016-11-03 ENCOUNTER — Other Ambulatory Visit: Payer: Self-pay | Admitting: Family

## 2016-11-03 DIAGNOSIS — N6320 Unspecified lump in the left breast, unspecified quadrant: Secondary | ICD-10-CM | POA: Diagnosis not present

## 2016-11-03 DIAGNOSIS — Z8052 Family history of malignant neoplasm of bladder: Secondary | ICD-10-CM

## 2016-11-09 ENCOUNTER — Encounter: Payer: Self-pay | Admitting: Family

## 2016-11-09 ENCOUNTER — Ambulatory Visit (INDEPENDENT_AMBULATORY_CARE_PROVIDER_SITE_OTHER): Payer: Commercial Managed Care - PPO | Admitting: Obstetrics and Gynecology

## 2016-11-09 ENCOUNTER — Encounter: Payer: Self-pay | Admitting: Obstetrics and Gynecology

## 2016-11-09 ENCOUNTER — Telehealth: Payer: Self-pay | Admitting: Family

## 2016-11-09 VITALS — BP 148/90 | HR 85 | Ht 63.0 in | Wt 171.0 lb

## 2016-11-09 DIAGNOSIS — Z01419 Encounter for gynecological examination (general) (routine) without abnormal findings: Secondary | ICD-10-CM | POA: Diagnosis not present

## 2016-11-09 DIAGNOSIS — N898 Other specified noninflammatory disorders of vagina: Secondary | ICD-10-CM | POA: Diagnosis not present

## 2016-11-09 DIAGNOSIS — Z124 Encounter for screening for malignant neoplasm of cervix: Secondary | ICD-10-CM | POA: Diagnosis not present

## 2016-11-09 DIAGNOSIS — Z1151 Encounter for screening for human papillomavirus (HPV): Secondary | ICD-10-CM

## 2016-11-09 DIAGNOSIS — Z1231 Encounter for screening mammogram for malignant neoplasm of breast: Secondary | ICD-10-CM | POA: Diagnosis not present

## 2016-11-09 DIAGNOSIS — Z1211 Encounter for screening for malignant neoplasm of colon: Secondary | ICD-10-CM

## 2016-11-09 DIAGNOSIS — Z1239 Encounter for other screening for malignant neoplasm of breast: Secondary | ICD-10-CM

## 2016-11-09 LAB — HEMOCCULT GUIAC POC 1CARD (OFFICE): FECAL OCCULT BLD: NEGATIVE

## 2016-11-09 NOTE — Telephone Encounter (Signed)
Call pt  Saw in OB note she was interested in cologuard  If yes, please fill out form and have her sign

## 2016-11-09 NOTE — Progress Notes (Signed)
Chief Complaint  Patient presents with  . Gynecologic Exam    HPI:      Ms. [name] is a 56 y.o. [phone] who LMP was Patient's last menstrual period was 10/16/2014 (exact date)., presents today for her annual examination.  Her menses are absent since 10/16. She does not have intermenstrual bleeding.  She does not have vasomotor sx.  Sex activity: single partner, contraception - post menopausal status. She does have vaginal dryness and uses lubricants with relief.  Last Pap: November 06, 2013  Results were: no abnormalities /neg HPV DNA.  Hx of STDs: none  Last mammogram: November 03, 2016  Results were: normal--routine follow-up in 12 months There is no FH of breast cancer. There is no FH of ovarian cancer. The patient does not do self-breast exams.  Colonoscopy: never done; pt considering cologard and will sched through PCP  Tobacco use: The patient denies current or previous tobacco use. Alcohol use: social drinker Exercise: not active  She does not get adequate calcium and Vitamin D in her diet. She has recent HTN dx with PCP and just started amlodipine. No side effects.   Past Medical History:  Diagnosis Date  . Cancer ([place])    skin  . Hypertension    readings    Past Surgical History:  Procedure Laterality Date  . BREAST CYST ASPIRATION Right 2002  . CYST REMOVAL TRUNK    . MOHS SURGERY  2002    Family History  Problem Relation Age of Onset  . Hypertension Mother   . Cancer Mother 21       bladder  . Hepatitis C Mother   . Cancer Father 71       bladder  . Stroke Maternal Grandmother   . Hypertension Maternal Grandmother   . Colon cancer Neg Hx     Social History   Social History  . Marital status: Married    Spouse name: N/A  . Number of children: N/A  . Years of education: N/A   Occupational History  . Not on file.   Social History Main Topics  . Smoking status: Never Smoker  . Smokeless tobacco: Never Used  . Alcohol use 1.2 oz/week   2 Glasses of wine per week  . Drug use: No  . Sexual activity: Yes   Other Topics Concern  . 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   Married   Takes care of grandchildren.    2 daughters in [place], son in [place].      Diet-regular, low salt.    Exercise watching grandchildren   Caffeine-4cups a day                  Current Outpatient Prescriptions:  .  amLODipine ([facility]) 2.5 MG tablet, Take 1 tablet (2.5 mg total) by mouth daily., Disp: 90 tablet, Rfl: 3   ROS:  Review of Systems  Constitutional: Negative for fatigue, fever and unexpected weight change.  Respiratory: Negative for cough, shortness of breath and wheezing.   Cardiovascular: Negative for chest pain, palpitations and leg swelling.  Gastrointestinal: Negative for blood in stool, constipation, diarrhea, nausea and vomiting.  Endocrine: Negative for cold intolerance, heat intolerance and polyuria.  Genitourinary: Negative for dyspareunia, dysuria, flank pain, frequency, genital sores, hematuria, menstrual problem, pelvic pain, urgency, vaginal bleeding, vaginal discharge and vaginal pain.  Musculoskeletal: Negative for back pain, joint swelling and myalgias.  Skin: Negative for rash.  Neurological: Negative for dizziness, syncope, light-headedness, numbness and headaches.  Hematological: Negative for adenopathy.  Psychiatric/Behavioral: Negative for agitation, confusion, sleep disturbance and suicidal ideas. The patient is not nervous/anxious.      Objective: BP (!) 148/90   Pulse 85   Ht 5\' 3"  (1.6 m)   Wt 171 lb (77.6 kg)   LMP 10/16/2014 (Exact Date) Comment: pt states periods are irregular  BMI 30.29 kg/m    Physical Exam  Constitutional: She is oriented to person, place, and time. She appears well-developed and well-nourished.  Genitourinary: Uterus normal.  There is lesion on the right labia. There is no rash or tenderness on the right labia.  There is  lesion on the left labia. There is no rash or tenderness on the left labia.    Vagina exhibits no lesion. No erythema or tenderness in the vagina. No vaginal discharge found. Right adnexum does not display mass and does not display tenderness. Left adnexum does not display mass and does not display tenderness. Cervix does not exhibit motion tenderness or polyp. Uterus is not enlarged or tender.  Genitourinary Comments: BILAT INTROITUS WITH PATCHES OF ERYTHEMA AND ROUGH TISSUE; NO ULCERS, QUESTION WARTY BUT HARD TO DIFFERENTIATE; SLIGHTLY TENDER TO PALPATE   Neck: Normal range of motion. No thyromegaly present.  Cardiovascular: Normal rate, regular rhythm and normal heart sounds.   No murmur heard. Pulmonary/Chest: Effort normal and breath sounds normal. Right breast exhibits no mass, no nipple discharge, no skin change and no tenderness. Left breast exhibits no mass, no nipple discharge, no skin change and no tenderness.  Abdominal: Soft. There is no tenderness. There is no guarding.  Musculoskeletal: Normal range of motion.  Neurological: She is alert and oriented to person, place, and time. No cranial nerve deficit.  Psychiatric: She has a normal mood and affect. Her behavior is normal.  Vitals reviewed.    Assessment/Plan:  Encounter for annual routine gynecological examination  Cervical cancer screening - Plan: IGP, Aptima HPV  Screening for HPV (human papillomavirus) - Plan: IGP, Aptima HPV  Vaginal lesion - Bilat at introitus. F/u with MD for further eval, question bx vs other.   Screening for colon cancer - Neg FOBT. Pt interested in [facility] but will sched with PCP. - Plan: POCT Occult Blood Stool  Screening for breast cancer - Mammo done with PCP.          GYN counsel breast self exam, mammography screening, menopause, adequate intake of calcium and vitamin D    F/U  Return in about 1 week (around 11/16/2016) for with MD for vag lesion.  Erin Labo B. Mitsuru Dault,  PA-C 11/09/2016 9:04 AM

## 2016-11-09 NOTE — Telephone Encounter (Signed)
Patient stated she was going to wait to call her insurance.  Patient will call back once she has an answer from them,

## 2016-11-11 LAB — IGP, APTIMA HPV
HPV Aptima: NEGATIVE
PAP SMEAR COMMENT: 0

## 2016-11-12 ENCOUNTER — Telehealth: Payer: Self-pay | Admitting: Family

## 2016-11-12 NOTE — Telephone Encounter (Signed)
Call pt  She has declined referral to urology  Please advise her to let us know if she changes her mind

## 2016-11-15 NOTE — Telephone Encounter (Signed)
Left message for patient to return call back.  

## 2016-11-16 NOTE — Telephone Encounter (Signed)
Spoke to patient, she has been informed.

## 2016-11-24 ENCOUNTER — Ambulatory Visit: Payer: Commercial Managed Care - PPO | Admitting: Obstetrics & Gynecology

## 2016-12-10 ENCOUNTER — Ambulatory Visit (INDEPENDENT_AMBULATORY_CARE_PROVIDER_SITE_OTHER): Payer: Commercial Managed Care - PPO | Admitting: Obstetrics and Gynecology

## 2016-12-10 ENCOUNTER — Encounter: Payer: Self-pay | Admitting: Obstetrics and Gynecology

## 2016-12-10 DIAGNOSIS — N9089 Other specified noninflammatory disorders of vulva and perineum: Secondary | ICD-10-CM | POA: Diagnosis not present

## 2016-12-10 NOTE — Progress Notes (Signed)
Obstetrics & Gynecology Office Visit   Chief Complaint:  Chief Complaint  Patient presents with  . Follow-up    History of Present Illness: 56 y.o. G79P3003 female who has a history notable for skin cancer who presents for evaluation of incidentally-noted vaginal lesions on pelvic exam during her annual gynecologic examination about 1 month ago.  She denies prior knowledge of these lesion and any associated symptoms.  She denies vaginal and vulvar bleeding, itching, irritation, erythema.  She is followed by her dermatologist for a history of skin cancer.  She had a normal pap smear at her visit on 11/09/16.     Past Medical History:  Diagnosis Date  . Cancer ([place])    skin  . Hypertension    readings    Past Surgical History:  Procedure Laterality Date  . BREAST CYST ASPIRATION Right 2002  . CYST REMOVAL TRUNK    . MOHS SURGERY  2002    Gynecologic History: Patient's last menstrual period was 10/16/2014 (exact date).  Obstetric History: [id number]  Family History  Problem Relation Age of Onset  . Hypertension Mother   . Cancer Mother 53       bladder  . Hepatitis C Mother   . Cancer Father 55       bladder  . Stroke Maternal Grandmother   . Hypertension Maternal Grandmother   . Colon cancer Neg Hx     Social History   Social History  . Marital status: Married    Spouse name: N/A  . Number of children: N/A  . Years of education: N/A   Occupational History  . Not on file.   Social History Main Topics  . Smoking status: Never Smoker  . Smokeless tobacco: Never Used  . Alcohol use 1.2 oz/week    2 Glasses of wine per week  . Drug use: No  . Sexual activity: Yes   Other Topics Concern  . 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   Married   Takes care of grandchildren.    2 daughters in [place], son in [place].      Diet-regular, low salt.    Exercise watching grandchildren   Caffeine-4cups a day                 No  Known Allergies  Prior to Admission medications   Medication Sig Start Date End Date Taking? Authorizing Provider  amLODipine (NORVASC) 2.5 MG tablet Take 1 tablet (2.5 mg total) by mouth daily. 10/28/16   Burnard Hawthorne, FNP    Review of Systems  Constitutional: Negative for chills, fever and weight loss.  Respiratory: Negative for cough, hemoptysis, sputum production and shortness of breath.   Cardiovascular: Negative for chest pain and palpitations.  Gastrointestinal: Negative for abdominal pain, blood in stool, constipation, diarrhea, heartburn, melena, nausea and vomiting.  Genitourinary: Negative for dysuria, frequency, hematuria and urgency.  Skin: Positive for rash (see HPI). Negative for itching.     Physical Exam BP 124/74   Ht 5\' 3"  (1.6 m)   Wt 170 lb (77.1 kg)   LMP 10/16/2014 (Exact Date) Comment: pt states periods are irregular  BMI 30.11 kg/m  Patient's last menstrual period was 10/16/2014 (exact date). Physical Exam  Constitutional: She appears well-developed and well-nourished. No distress.  Genitourinary: Uterus normal. Pelvic exam was performed with patient supine.  There is lesion on the right labia. There is no rash, tenderness or injury on the  right labia.  There is lesion on the left labia. There is no rash, tenderness or injury on the left labia.    Vagina exhibits no lesion. No erythema or bleeding in the vagina. No signs of injury around the vagina. Right adnexum does not display mass, does not display tenderness and does not display fullness. Left adnexum does not display mass, does not display tenderness and does not display fullness. Cervix does not exhibit motion tenderness, lesion, discharge or polyp. Uterus is not enlarged, tender, exhibiting a mass or mobile.  Genitourinary Comments: Bilateral introitus with discrete erythematous plaques with various degrees of thickness, no induration, non-tender, somewhat serpiginous and thin.  No erosions or  ulcerations, somewhat symmetric in general distribution, but not exactly symmetric.  HENT:  Head: Normocephalic and atraumatic.  Cardiovascular: Normal rate, regular rhythm and normal heart sounds.   Pulmonary/Chest: Effort normal and breath sounds normal.  Abdominal: Soft. She exhibits no distension and no mass. There is no tenderness. There is no rebound and no guarding.  Skin:  See GU exam    Female chaperone present for pelvic and breast  portions of the physical exam  Assessment: 56 y.o. G52P3003 female with vulvar lesion  Plan: Discussed her lesion. Discussed that I do not know what the lesions is based on exam alone.  She has a history of skin cancer, but none recently or active.  I offered to perform a biopsy to give her a diagnosis.  However, she has to work today and would rather not go to work after a biopsy.  We discussed monitoring for another short interval of time.  If no improvement, then biopsy.  She agrees to this plan and I believe it is reasonable.  However, I did warn her that because I do not know exactly the nature of her lesion, we may be delaying diagnosing a malignant lesion. This could lead to a worse prognosis, potentially.  She voiced understanding and desire to follow up in one month.    Prentice Docker, MD 12/10/2016 2:20 PM

## 2016-12-14 DIAGNOSIS — N9089 Other specified noninflammatory disorders of vulva and perineum: Secondary | ICD-10-CM | POA: Insufficient documentation

## 2016-12-22 ENCOUNTER — Encounter: Payer: Self-pay | Admitting: Family

## 2016-12-22 ENCOUNTER — Ambulatory Visit (INDEPENDENT_AMBULATORY_CARE_PROVIDER_SITE_OTHER): Payer: Commercial Managed Care - PPO | Admitting: Family

## 2016-12-22 VITALS — BP 146/84 | HR 83 | Temp 98.4°F | Ht 63.0 in | Wt 171.4 lb

## 2016-12-22 DIAGNOSIS — I10 Essential (primary) hypertension: Secondary | ICD-10-CM | POA: Diagnosis not present

## 2016-12-22 DIAGNOSIS — R42 Dizziness and giddiness: Secondary | ICD-10-CM | POA: Insufficient documentation

## 2016-12-22 MED ORDER — MECLIZINE HCL 25 MG PO TABS: 25.0000 mg | ORAL_TABLET | Freq: Two times a day (BID) | ORAL | 0 refills | Status: DC

## 2016-12-22 NOTE — Patient Instructions (Addendum)
Your LDL cholesterol is elevated. However your risk for cardiovascular disease is low,  Less than  7.5% over the next 10 years so we won't start medication therapy. Please follow low trans fat and low saturated fat diet. Maintain exercise 30 minutes per day , 5 times per week and we should be able to turn this around.    Monitor blood pressure,  Goal is less than 130/80; if persistently higher, please increase amlodipine to 5mg  once per day and let know if blood pressures don't improve.   At this point, I suspect Benign paroxysmal positional vertigo (BPPV) and have included information from [facility] below.   You may look videos for Epley's maneuvers as we discussed online.   If dizziness/vertigo persists, a referral to ENT for further evaluation and MRI Brain.   If there is no improvement in your symptoms, or if there is any worsening of symptoms, or if you have any additional concerns, please return to this clinic for re-evaluation; or, if we are closed, consider going to the Emergency Room for evaluation.   What is BPPV?  BPPV  is one of the most common causes of vertigo - the sudden sensation that you're spinning or that the inside of your head is spinning. Benign paroxysmal positional vertigo causes brief episodes of mild to intense dizziness. Benign paroxysmal positional vertigo is usually triggered by specific changes in the position of your head. This might occur when you tip your head up or down, when you lie down, or when you turn over or sit up in bed. Although benign paroxysmal positional vertigo can be a bothersome problem, it's rarely serious except when it increases the chance of falls.  If you experience dizziness associated with benign paroxysmal positional vertigo (BPPV), consider these tips: Be aware of the possibility of losing your balance, which can lead to falling and serious injury.  Sit down immediately when you feel dizzy.  Use good lighting if you get up at night.   Walk with a cane for stability if you're at risk of falling.  Work closely with your doctor to manage your symptoms effectively. BPPV may recur even after successful therapy. Fortunately, although there's no cure, the condition can be managed with physical therapy and home treatments.   Dizziness [Uncertain Cause] Dizziness is a common symptom sometimes described as "lightheadedness" or feeling like you are going to faint. If it lasts for only a few seconds and is related to changes in position (such as getting up after lying or sitting for a long time), it is usually not a sign of anything serious. Dizziness that lasts for minutes to hours, or comes on for no apparent reason, may be a sign of a more serious problem (such as dehydration, a medicine reaction, disease of the heart or brain). Today's exam did not show an exact cause for your dizzy spell . Sometimes additional tests are required before a cause can be found. Therefore, it is important to follow up with your doctor if your symptoms continue. Home Care: 1) If a dizzy spell occurs and lasts more than a few seconds, lie down until it passes. If you are lying down, then you cannot hurt yourself by falling if you do faint. 2) Do not drive or operate dangerous equipment until the dizzy spells have stopped for at least 48 hours. 3) If dizzy spells occur with sudden standing, this may be a sign of mild dehydration. Drink extra fluids over the next few days. 4) If  you recently started a new medicine or if you had the dose of a current medicine increased (especially blood pressure medicine), talk with the prescribing doctor about your symptoms. Dose adjustments may be needed. Follow Up with your doctor for further evaluation within the next seven days, if your symptoms continue. Get Prompt Medical Attention if any of the following occur: -- Worsening of your symptoms -- Fainting, headache or seizure -- Repeated vomiting -- Feeling like you or  the room is spinning -- Chest, arm, neck, back or jaw pain -- Palpitations (the sense that your heart is fluttering or beating fast or hard) -- Shortness of breath -- Blood in vomit or stool (black or red color) -- Weakness of an arm or leg or one side of the face -- Difficulty with speech or vision  2000-2015 [employer] [address], [place] [address]. All rights reserved. This information is not intended as a substitute for professional medical care. Always follow your healthcare professional's instructions.

## 2016-12-22 NOTE — Assessment & Plan Note (Signed)
Elevated, increase amlodipine to 5mg . Will keep bp log at home and let me know if not controlled.

## 2016-12-22 NOTE — Progress Notes (Signed)
Subjective:    Patient ID: [name], female    DOB: 1961/03/13, 56 y.o.   MRN: [id number]  CC: [name] is a 56 y.o. female who presents today for follow up.   HPI: HTN-started amlodipine at last visit.  Denies exertional chest pain or pressure, numbness or tingling radiating to left arm or jaw, palpitations, dizziness, frequent headaches, changes in vision, or shortness of breath.   Complains of vertigo, more frequently in 2018. Total of 3 self limiting episodes.    Feels like room is spinning. Wakes up with vertigo when episode occurs. If reaches up to grab something, will make episode worse. No HA, blurry vision, hearing loss, ear drainage, ear pressure, tinnitus, dizziness. Will go away on it's own after one day.   Tried benadryl with no relief.   H/o vertigo however used to get once per year.     HISTORY:  Past Medical History:  Diagnosis Date  . Cancer ([place])    skin  . Hypertension    readings   Past Surgical History:  Procedure Laterality Date  . BREAST CYST ASPIRATION Right 2002  . CYST REMOVAL TRUNK    . MOHS SURGERY  2002   Family History  Problem Relation Age of Onset  . Hypertension Mother   . Cancer Mother 18       bladder  . Hepatitis C Mother   . Cancer Father 6       bladder  . Stroke Maternal Grandmother   . Hypertension Maternal Grandmother   . Colon cancer Neg Hx     Allergies: Patient has no known allergies. Current Outpatient Prescriptions on File Prior to Visit  Medication Sig Dispense Refill  . amLODipine (NORVASC) 2.5 MG tablet Take 1 tablet (2.5 mg total) by mouth daily. 90 tablet 3   No current facility-administered medications on file prior to visit.     Social History  Substance Use Topics  . Smoking status: Never Smoker  . Smokeless tobacco: Never Used  . Alcohol use 1.2 oz/week    2 Glasses of wine per week    Review of Systems  Constitutional: Negative for chills and fever.  HENT: Negative for ear discharge,  ear pain and hearing loss.   Eyes: Negative for visual disturbance.  Respiratory: Negative for cough.   Cardiovascular: Negative for chest pain and palpitations.  Gastrointestinal: Negative for nausea and vomiting.  Neurological: Negative for dizziness, syncope, weakness and headaches.      Objective:    BP (!) 146/84   Pulse 83   Temp 98.4 F (36.9 C) (Oral)   Ht 5\' 3"  (1.6 m)   Wt 171 lb 6.4 oz (77.7 kg)   LMP 10/16/2014 (Exact Date) Comment: pt states periods are irregular  SpO2 98%   BMI 30.36 kg/m  BP Readings from Last 3 Encounters:  12/22/16 (!) 146/84  12/10/16 124/74  11/09/16 (!) 148/90   Wt Readings from Last 3 Encounters:  12/22/16 171 lb 6.4 oz (77.7 kg)  12/10/16 170 lb (77.1 kg)  11/09/16 171 lb (77.6 kg)    Physical Exam  Constitutional: She appears well-developed and well-nourished.  HENT:  Mouth/Throat: Uvula is midline, oropharynx is clear and moist and mucous membranes are normal.  Eyes: Pupils are equal, round, and reactive to light. Conjunctivae and EOM are normal.  Fundus normal bilaterally.   Cardiovascular: Normal rate, regular rhythm, normal heart sounds and normal pulses.   Pulmonary/Chest: Effort normal and breath sounds normal. She has no  wheezes. She has no rhonchi. She has no rales.  Neurological: She is alert. She has normal strength. No cranial nerve deficit or sensory deficit. She displays a negative Romberg sign.  Reflex Scores:      Bicep reflexes are 2+ on the right side and 2+ on the left side.      Patellar reflexes are 2+ on the right side and 2+ on the left side. Grip equal and strong bilateral upper extremities. Gait strong and steady. Able to perform rapid alternating movement without difficulty.   Skin: Skin is warm and dry.  Psychiatric: She has a normal mood and affect. Her speech is normal and behavior is normal. Thought content normal.  Vitals reviewed.      Assessment & Plan:   Problem List Items Addressed This Visit       Cardiovascular and Mediastinum   Essential hypertension, benign     Other   Vertigo - Primary    Reassured by normal neurologic exam. Unable to perform Dix-Hallpike maneuver as patient was concerned she would start having vertigo and she needs to drive home, she is also accompanied by her grandson today. I think that is reasonable. Red flag symptoms given to patient as well as discussed if frequency or severity of vertigo were to change any way, she would let me know immediately. At that point, we will consult ear nose and throat, pursue MRI brain. At this time, I prescribed meclizine for a trial to see if his next episode, meclizine can cause early resolution of symptoms. Return precautions given.      Relevant Medications   meclizine (ANTIVERT) 25 MG tablet       I am having Ms. Bigos start on meclizine. I am also having her maintain her amLODipine.   Meds ordered this encounter  Medications  . meclizine (ANTIVERT) 25 MG tablet    Sig: Take 1 tablet (25 mg total) by mouth 2 (two) times daily. As needed for vertigo    Dispense:  30 tablet    Refill:  0    Order Specific Question:   Supervising Provider    Answer:   Crecencio Mc [[id number]]    Return precautions given.   Risks, benefits, and alternatives of the medications and treatment plan prescribed today were discussed, and patient expressed understanding.   Education regarding symptom management and diagnosis given to patient on AVS.  Continue to follow with Burnard Hawthorne, FNP for routine health maintenance.   Jodi Geralds and I agreed with plan.   Mable Paris, FNP

## 2016-12-22 NOTE — Assessment & Plan Note (Signed)
Reassured by normal neurologic exam. Unable to perform Dix-Hallpike maneuver as patient was concerned she would start having vertigo and she needs to drive home, she is also accompanied by her grandson today. I think that is reasonable. Red flag symptoms given to patient as well as discussed if frequency or severity of vertigo were to change any way, she would let me know immediately. At that point, we will consult ear nose and throat, pursue MRI brain. At this time, I prescribed meclizine for a trial to see if his next episode, meclizine can cause early resolution of symptoms. Return precautions given.

## 2017-01-03 ENCOUNTER — Telehealth: Payer: Self-pay | Admitting: Family

## 2017-01-03 NOTE — Telephone Encounter (Signed)
Left message for patient to return call back.  

## 2017-01-03 NOTE — Telephone Encounter (Signed)
Call pt  Please confirm that she has no family h/o colon cancer,  Polyps;  Also ensure she has no blood in stool or change in BM  If so, I have completed cologuard form and she may come to office to sign

## 2017-01-05 NOTE — Telephone Encounter (Signed)
Letter has been mailed.

## 2017-01-05 NOTE — Telephone Encounter (Signed)
Mail letter please

## 2017-01-07 ENCOUNTER — Encounter: Payer: Self-pay | Admitting: Obstetrics and Gynecology

## 2017-01-07 ENCOUNTER — Ambulatory Visit (INDEPENDENT_AMBULATORY_CARE_PROVIDER_SITE_OTHER): Payer: Commercial Managed Care - PPO | Admitting: Obstetrics and Gynecology

## 2017-01-07 VITALS — BP 128/88 | Ht 63.0 in | Wt 172.0 lb

## 2017-01-07 DIAGNOSIS — N9089 Other specified noninflammatory disorders of vulva and perineum: Secondary | ICD-10-CM | POA: Diagnosis not present

## 2017-01-07 NOTE — Progress Notes (Signed)
  Obstetrics & Gynecology Office Visit   Chief Complaint  Patient presents with  . Follow-up  Vulvar skin lesion  History of Present Illness: 56 y.o. G100P3003 female who was noted on routine gynecologic exam to have vulvar lesions. She was evaluated by me about 1 month ago and refused biopsy in favor of monitoring (she had work after the clinic visit and did not want to go in after a biopsy).   ROS - negative  Physical Exam BP 128/88   Ht 5\' 3"  (1.6 m)   Wt 172 lb (78 kg)   LMP 10/16/2014 (Exact Date) Comment: pt states periods are irregular  BMI 30.47 kg/m  Patient's last menstrual period was 10/16/2014 (exact date). Physical Exam  Constitutional: She appears well-developed and well-nourished. No distress.  Genitourinary:     Psychiatric: She has a normal mood and affect. Her behavior is normal. Judgment normal.    See prior note and documentation. No change.  Procedure Note: Punch Biopsy  Patient identified, informed consent performed, consent signed.  Skin prepped with betadine x 2 (after verifying no allergies).  1% lidocaine without epinephrine, about 1 mL, was injected into the area noted above.  A 3.5 mm punch biopsy blade was used. The specimen was grasped at the base with pickups and the remaining attachment severed with scissors.  The skin was re-approximated using #3-0 vicryl in a single, figure-of-eight stitch and hemostasis was obtained.  The area was cleaned after the procedure. The specimen was placed in formalin.   The patient tolerated the procedure well.   Female chaperone present for pelvic and breast  portions of the physical exam  Assessment: 56 y.o. G47P3003 female with a vulvar lesion.   Plan: Problem List Items Addressed This Visit    Vulvar lesion - Primary   Relevant Orders   Pathology    Punch biopsy today.   Prentice Docker, MD 01/07/2017 9:18 AM

## 2017-01-11 LAB — PATHOLOGY

## 2017-01-14 ENCOUNTER — Telehealth: Payer: Self-pay | Admitting: Obstetrics and Gynecology

## 2017-01-14 NOTE — Telephone Encounter (Signed)
Discussed benign findings. Discussed possible role of skin irritants. She will keep regular follow up with [name] next year.  All questions answered.

## 2017-01-14 NOTE — Telephone Encounter (Signed)
Pt calling my line, I advised her I will let SDJ know that she called back and he will call her back soon. fwding to [facility]

## 2017-01-14 NOTE — Telephone Encounter (Signed)
Left generic VM 

## 2017-02-07 ENCOUNTER — Encounter: Payer: Self-pay | Admitting: Family

## 2017-02-15 LAB — COLOGUARD

## 2017-02-16 ENCOUNTER — Telehealth: Payer: Self-pay | Admitting: Family

## 2017-02-16 DIAGNOSIS — K921 Melena: Secondary | ICD-10-CM

## 2017-02-16 NOTE — Telephone Encounter (Signed)
Call patient Her Cologard is positive which means the blood was found in the colon. At this point she will need a colonoscopy,  I have placed order for this. We will call her to schedule, please advise her to call us if she does not hear from [place]

## 2017-02-16 NOTE — Telephone Encounter (Signed)
Patient was informed of results.  Patient understood and no questions, comments, or concerns at this time.  

## 2017-02-24 ENCOUNTER — Telehealth: Payer: Self-pay | Admitting: Family

## 2017-02-24 DIAGNOSIS — I10 Essential (primary) hypertension: Secondary | ICD-10-CM

## 2017-02-24 MED ORDER — AMLODIPINE BESYLATE 5 MG PO TABS: 5.0000 mg | ORAL_TABLET | Freq: Every day | ORAL | 1 refills | Status: DC

## 2017-02-24 NOTE — Telephone Encounter (Signed)
I couldn't refill this because the provider did tell her to increase medication but the order wasn't changed.Thanks.

## 2017-02-24 NOTE — Telephone Encounter (Signed)
Copied from [place] [id number]. Topic: Quick Communication - See Telephone Encounter >> Feb 24, 2017  8:42 AM Aurelio Brash B wrote: CRM for notification. See Telephone encounter for: Refill amlodipine  PT has been doubling up on the meds as her Dr told her to and now is only has two left,  [facility] not [facility] because [facility] too soon  02/24/17.

## 2017-02-24 NOTE — Telephone Encounter (Signed)
Please advise 

## 2017-02-24 NOTE — Telephone Encounter (Signed)
Medication has been refilled.

## 2017-03-07 ENCOUNTER — Other Ambulatory Visit: Payer: Self-pay

## 2017-03-08 ENCOUNTER — Encounter: Payer: Self-pay | Admitting: Gastroenterology

## 2017-03-08 ENCOUNTER — Encounter: Payer: Self-pay | Admitting: *Deleted

## 2017-03-08 ENCOUNTER — Other Ambulatory Visit
Admission: RE | Admit: 2017-03-08 | Discharge: 2017-03-08 | Disposition: A | Discharge status: Home / Self Care | Payer: Commercial Managed Care - PPO | Source: Ambulatory Visit | Attending: Gastroenterology | Admitting: Gastroenterology

## 2017-03-08 ENCOUNTER — Ambulatory Visit (INDEPENDENT_AMBULATORY_CARE_PROVIDER_SITE_OTHER): Payer: Commercial Managed Care - PPO | Admitting: Gastroenterology

## 2017-03-08 ENCOUNTER — Other Ambulatory Visit: Payer: Self-pay

## 2017-03-08 VITALS — HR 89 | Temp 98.7°F | Ht 63.0 in | Wt 173.4 lb

## 2017-03-08 DIAGNOSIS — Z0000 Encounter for general adult medical examination without abnormal findings: Secondary | ICD-10-CM | POA: Insufficient documentation

## 2017-03-08 DIAGNOSIS — R195 Other fecal abnormalities: Secondary | ICD-10-CM

## 2017-03-08 LAB — HEPATIC FUNCTION PANEL
ALBUMIN: 4.5 g/dL (ref 3.5–5.0)
ALK PHOS: 79 U/L (ref 38–126)
ALT: 16 U/L (ref 14–54)
AST: 17 U/L (ref 15–41)
BILIRUBIN TOTAL: 0.8 mg/dL (ref 0.3–1.2)
Bilirubin, Direct: <0.1 mg/dL — ABNORMAL LOW (ref 0.1–0.5)
TOTAL PROTEIN: 8.2 g/dL — AB (ref 6.5–8.1)

## 2017-03-08 LAB — CBC
HEMATOCRIT: 40.8 % (ref 35.0–47.0)
HEMOGLOBIN: 14 g/dL (ref 12.0–16.0)
MCH: 30.6 pg (ref 26.0–34.0)
MCHC: 34.3 g/dL (ref 32.0–36.0)
MCV: 89.2 fL (ref 80.0–100.0)
Platelets: 255 10*3/uL (ref 150–440)
RBC: 4.58 MIL/uL (ref 3.80–5.20)
RDW: 12.6 % (ref 11.5–14.5)
WBC: 7.8 10*3/uL (ref 3.6–11.0)

## 2017-03-08 LAB — BASIC METABOLIC PANEL
Anion gap: 10 (ref 5–15)
BUN: 16 mg/dL (ref 6–20)
CHLORIDE: 102 mmol/L (ref 101–111)
CO2: 26 mmol/L (ref 22–32)
Calcium: 9.2 mg/dL (ref 8.9–10.3)
Creatinine, Ser: 0.72 mg/dL (ref 0.44–1.00)
GFR calc non Af Amer: >60 mL/min (ref >60)
Glucose, Bld: 123 mg/dL — ABNORMAL HIGH (ref 65–99)
POTASSIUM: 3.8 mmol/L (ref 3.5–5.1)
SODIUM: 138 mmol/L (ref 135–145)

## 2017-03-08 NOTE — Progress Notes (Signed)
Cephas Darby, MD [address]  [place]  [place], [place] [address]  Main: [phone]  Fax: [phone]    Gastroenterology Consultation  Referring Provider:     Burnard Hawthorne, FNP Primary Care Physician:  Burnard Hawthorne, FNP Primary Gastroenterologist:  Dr. Cephas Darby Reason for Consultation:     Positive Colo guard test        HPI:   [name] is a 56 y.o. female referred by Dr. Burnard Hawthorne, FNP  for consultation & management of positive Colo guard test to discuss about colonoscopy.  She has history of hypertension, otherwise healthy denies any GI complaints today.  She was putting off colonoscopy because she did not want to prep and she did not want to go to the whole process associated with it.  I did not see any routine labs like CBC, CMP in her chart.  She is otherwise healthy  NSAIDs: None  Antiplts/Anticoagulants/Anti thrombotics: None  GI Procedures: None She denies abdominal surgeries Denies family history of GI malignancy She does not smoke, drinks alcohol occasionally.  Denies illicit drug use  Past Medical History:  Diagnosis Date  . Cancer ([place])    skin  . Hypertension    readings    Past Surgical History:  Procedure Laterality Date  . BREAST CYST ASPIRATION Right 2002  . CYST REMOVAL TRUNK    . MOHS SURGERY  2002    Prior to Admission medications   Medication Sig Start Date End Date Taking? Authorizing Provider  amLODipine (NORVASC) 2.5 MG tablet Take 2.5 mg by mouth daily. 01/07/17   [provider]  amLODipine (NORVASC) 5 MG tablet Take 1 tablet (5 mg total) daily by mouth. 02/24/17   Burnard Hawthorne, FNP  meclizine (ANTIVERT) 25 MG tablet Take 1 tablet (25 mg total) by mouth 2 (two) times daily. As needed for vertigo 12/22/16   Burnard Hawthorne, FNP    Family History  Problem Relation Age of Onset  . Hypertension Mother   . Cancer Mother 67       bladder  . Hepatitis C Mother   . Cancer  Father 58       bladder  . Stroke Maternal Grandmother   . Hypertension Maternal Grandmother   . Colon cancer Neg Hx      Social History   Tobacco Use  . Smoking status: Never Smoker  . Smokeless tobacco: Never Used  Substance Use Topics  . Alcohol use: Yes    Alcohol/week: 1.2 oz    Types: 2 Glasses of wine per week  . Drug use: No    Allergies as of 03/08/2017  . (No Known Allergies)    Review of Systems:    All systems reviewed and negative except where noted in HPI.   Physical Exam:  Pulse 89   Temp 98.7 F (37.1 C)   Ht 5\' 3"  (1.6 m)   Wt 173 lb 6.4 oz (78.7 kg)   LMP 10/16/2014 (Exact Date) Comment: pt states periods are irregular  BMI 30.72 kg/m  Patient's last menstrual period was 10/16/2014 (exact date).  General:   Alert,  Well-developed, well-nourished, pleasant and cooperative in NAD Head:  Normocephalic and atraumatic. Eyes:  Sclera clear, no icterus.   Conjunctiva pink. Ears:  Normal auditory acuity. Nose:  No deformity, discharge, or lesions. Mouth:  No deformity or lesions,oropharynx pink & moist. Neck:  Supple; no masses or thyromegaly. Lungs:  Respirations even and unlabored.  Clear throughout to auscultation.   No wheezes, crackles, or rhonchi. No acute distress. Heart:  Regular rate and rhythm; no murmurs, clicks, rubs, or gallops. Abdomen:  Normal bowel sounds. Soft, non-tender and non-distended without masses, hepatosplenomegaly or hernias noted.  No guarding or rebound tenderness.   Rectal: Nor performed Msk:  Symmetrical without gross deformities. Good, equal movement & strength bilaterally. Pulses:  Normal pulses noted. Extremities:  No clubbing or edema.  No cyanosis. Neurologic:  Alert and oriented x3;  grossly normal neurologically. Skin:  Intact without significant lesions or rashes. No jaundice. Lymph Nodes:  No significant cervical adenopathy. Psych:  Alert and cooperative. Normal mood and affect.  Imaging  Studies: None  Assessment and Plan:   [name] is a 56 y.o. female with history of hypertension, referred for diagnostic colonoscopy given that she had positive Colo guard test recently.  I discussed with her about risks and benefits of the procedure and she is agreeable to it  -Schedule colonoscopy for next week -Check CBC, CMP  I have discussed alternative options, risks & benefits,  which include, but are not limited to, bleeding, infection, perforation,respiratory complication & drug reaction.  The patient agrees with this plan & written consent will be obtained.    Follow up based on the colonoscopy results   Cephas Darby, MD

## 2017-03-10 ENCOUNTER — Encounter: Payer: Self-pay | Admitting: Family

## 2017-03-10 NOTE — Telephone Encounter (Signed)
FYI

## 2017-03-15 NOTE — Discharge Instructions (Signed)
General Anesthesia, Adult, Care After °These instructions provide you with information about caring for yourself after your procedure. Your health care provider may also give you more specific instructions. Your treatment has been planned according to current medical practices, but problems sometimes occur. Call your health care provider if you have any problems or questions after your procedure. °What can I expect after the procedure? °After the procedure, it is common to have: °· Vomiting. °· A sore throat. °· Mental slowness. ° °It is common to feel: °· Nauseous. °· Cold or shivery. °· Sleepy. °· Tired. °· Sore or achy, even in parts of your body where you did not have surgery. ° °Follow these instructions at home: °For at least 24 hours after the procedure: °· Do not: °? Participate in activities where you could fall or become injured. °? Drive. °? Use heavy machinery. °? Drink alcohol. °? Take sleeping pills or medicines that cause drowsiness. °? Make important decisions or sign legal documents. °? Take care of children on your own. °· Rest. °Eating and drinking °· If you vomit, drink water, juice, or soup when you can drink without vomiting. °· Drink enough fluid to keep your urine clear or pale yellow. °· Make sure you have little or no nausea before eating solid foods. °· Follow the diet recommended by your health care provider. °General instructions °· Have a responsible adult stay with you until you are awake and alert. °· Return to your normal activities as told by your health care provider. Ask your health care provider what activities are safe for you. °· Take over-the-counter and prescription medicines only as told by your health care provider. °· If you smoke, do not smoke without supervision. °· Keep all follow-up visits as told by your health care provider. This is important. °Contact a health care provider if: °· You continue to have nausea or vomiting at home, and medicines are not helpful. °· You  cannot drink fluids or start eating again. °· You cannot urinate after 8-12 hours. °· You develop a skin rash. °· You have fever. °· You have increasing redness at the site of your procedure. °Get help right away if: °· You have difficulty breathing. °· You have chest pain. °· You have unexpected bleeding. °· You feel that you are having a life-threatening or urgent problem. °This information is not intended to replace advice given to you by your health care provider. Make sure you discuss any questions you have with your health care provider. °Document Released: 07/05/2000 Document Revised: 09/01/2015 Document Reviewed: 03/13/2015 °Elsevier Interactive Patient Education © 2018 Else[employer] Inc. ° °

## 2017-03-16 ENCOUNTER — Encounter
Admission: RE | Disposition: A | Discharge status: Home / Self Care | Payer: Self-pay | Source: Ambulatory Visit | Attending: Gastroenterology

## 2017-03-16 ENCOUNTER — Ambulatory Visit: Payer: Commercial Managed Care - PPO | Admitting: Anesthesiology

## 2017-03-16 ENCOUNTER — Encounter: Payer: Self-pay | Admitting: Gastroenterology

## 2017-03-16 ENCOUNTER — Ambulatory Visit
Admission: RE | Admit: 2017-03-16 | Discharge: 2017-03-16 | Disposition: A | Discharge status: Home / Self Care | Payer: Commercial Managed Care - PPO | Source: Ambulatory Visit | Attending: Gastroenterology | Admitting: Gastroenterology

## 2017-03-16 DIAGNOSIS — Z8249 Family history of ischemic heart disease and other diseases of the circulatory system: Secondary | ICD-10-CM | POA: Insufficient documentation

## 2017-03-16 DIAGNOSIS — K921 Melena: Secondary | ICD-10-CM | POA: Insufficient documentation

## 2017-03-16 DIAGNOSIS — Z8052 Family history of malignant neoplasm of bladder: Secondary | ICD-10-CM | POA: Diagnosis not present

## 2017-03-16 DIAGNOSIS — K644 Residual hemorrhoidal skin tags: Secondary | ICD-10-CM | POA: Diagnosis not present

## 2017-03-16 DIAGNOSIS — D122 Benign neoplasm of ascending colon: Secondary | ICD-10-CM | POA: Insufficient documentation

## 2017-03-16 DIAGNOSIS — I10 Essential (primary) hypertension: Secondary | ICD-10-CM | POA: Diagnosis not present

## 2017-03-16 DIAGNOSIS — Z823 Family history of stroke: Secondary | ICD-10-CM | POA: Diagnosis not present

## 2017-03-16 DIAGNOSIS — R195 Other fecal abnormalities: Secondary | ICD-10-CM

## 2017-03-16 DIAGNOSIS — Z79899 Other long term (current) drug therapy: Secondary | ICD-10-CM | POA: Diagnosis not present

## 2017-03-16 DIAGNOSIS — D124 Benign neoplasm of descending colon: Secondary | ICD-10-CM | POA: Insufficient documentation

## 2017-03-16 DIAGNOSIS — Z85828 Personal history of other malignant neoplasm of skin: Secondary | ICD-10-CM | POA: Insufficient documentation

## 2017-03-16 DIAGNOSIS — Z8379 Family history of other diseases of the digestive system: Secondary | ICD-10-CM | POA: Insufficient documentation

## 2017-03-16 DIAGNOSIS — K5730 Diverticulosis of large intestine without perforation or abscess without bleeding: Secondary | ICD-10-CM | POA: Insufficient documentation

## 2017-03-16 DIAGNOSIS — K621 Rectal polyp: Secondary | ICD-10-CM | POA: Insufficient documentation

## 2017-03-16 HISTORY — PX: COLONOSCOPY WITH PROPOFOL: SHX5780

## 2017-03-16 HISTORY — PX: POLYPECTOMY: SHX5525

## 2017-03-16 SURGERY — COLONOSCOPY WITH PROPOFOL
Anesthesia: General | Wound class: Clean Contaminated

## 2017-03-16 MED ORDER — ONDANSETRON HCL 4 MG/2ML IJ SOLN: 4.0000 mg | Freq: Once | INTRAMUSCULAR | Status: DC | PRN

## 2017-03-16 MED ORDER — PROPOFOL 10 MG/ML IV BOLUS
INTRAVENOUS | Status: DC | PRN
  Administered 2017-03-16: 100 mg via INTRAVENOUS
  Administered 2017-03-16: 30 mg via INTRAVENOUS
  Administered 2017-03-16: 50 mg via INTRAVENOUS
  Administered 2017-03-16: 40 mg via INTRAVENOUS
  Administered 2017-03-16: 50 mg via INTRAVENOUS
  Administered 2017-03-16: 40 mg via INTRAVENOUS
  Administered 2017-03-16: 20 mg via INTRAVENOUS
  Administered 2017-03-16: 30 mg via INTRAVENOUS
  Administered 2017-03-16: 40 mg via INTRAVENOUS

## 2017-03-16 MED ORDER — LACTATED RINGERS IV SOLN
INTRAVENOUS | Status: DC
  Administered 2017-03-16: 08:00:00 via INTRAVENOUS

## 2017-03-16 MED ORDER — ACETAMINOPHEN 160 MG/5ML PO SOLN: 325.0000 mg | ORAL | Status: DC | PRN

## 2017-03-16 MED ORDER — LIDOCAINE HCL (CARDIAC) 20 MG/ML IV SOLN
INTRAVENOUS | Status: DC | PRN
  Administered 2017-03-16: 50 mg via INTRAVENOUS

## 2017-03-16 MED ORDER — ACETAMINOPHEN 325 MG PO TABS: 650.0000 mg | ORAL_TABLET | Freq: Once | ORAL | Status: DC | PRN

## 2017-03-16 SURGICAL SUPPLY — 23 items

## 2017-03-16 NOTE — Anesthesia Postprocedure Evaluation (Signed)
Anesthesia Post Note  Patient: [name]  Procedure(s) Performed: COLONOSCOPY WITH PROPOFOL (N/A ) POLYPECTOMY  Patient location during evaluation: PACU Anesthesia Type: General Level of consciousness: awake and alert, oriented and patient cooperative Pain management: pain level controlled Vital Signs Assessment: post-procedure vital signs reviewed and stable Respiratory status: spontaneous breathing, nonlabored ventilation and respiratory function stable Cardiovascular status: blood pressure returned to baseline and stable Postop Assessment: adequate PO intake Anesthetic complications: no    Darrin Nipper

## 2017-03-16 NOTE — Anesthesia Procedure Notes (Signed)
Date/Time: 03/16/2017 8:37 AM Performed by: Cameron Ali, CRNA Pre-anesthesia Checklist: Patient identified, Emergency Drugs available, Suction available, Timeout performed and Patient being monitored Patient Re-evaluated:Patient Re-evaluated prior to induction Oxygen Delivery Method: Nasal cannula Placement Confirmation: positive ETCO2

## 2017-03-16 NOTE — Op Note (Signed)
[facility] Patient Name: [name] Procedure Date: 03/16/2017 8:29 AM MRN: [id number] Account #: [id number] Date of Birth: 1960-05-13 Admit Type: Outpatient Age: 56 Room: [facility] OR ROOM 01 Gender: Female Note Status: Finalized Procedure:            Colonoscopy Indications:          Positive Cologuard test Providers:            Lin Landsman MD, MD Referring MD:         Yvetta Coder. Arnett (Referring MD) Medicines:            Monitored Anesthesia Care Complications:        No immediate complications. Estimated blood loss: None. Procedure:            Pre-Anesthesia Assessment:                       - Prior to the procedure, a History and Physical was                        performed, and patient medications and allergies were                        reviewed. The patient is competent. The risks and                        benefits of the procedure and the sedation options and                        risks were discussed with the patient. All questions                        were answered and informed consent was obtained.                        Patient identification and proposed procedure were                        verified by the physician, the nurse, the                        anesthesiologist, the anesthetist and the technician in                        the pre-procedure area in the procedure room. Mental                        Status Examination: alert and oriented. Airway                        Examination: normal oropharyngeal airway and neck                        mobility. Respiratory Examination: clear to                        auscultation. CV Examination: normal. Prophylactic                        Antibiotics: The patient does not require prophylactic  antibiotics. Prior Anticoagulants: The patient has                        taken no previous anticoagulant or antiplatelet agents.                        ASA  Grade Assessment: II - A patient with mild systemic                        disease. After reviewing the risks and benefits, the                        patient was deemed in satisfactory condition to undergo                        the procedure. The anesthesia plan was to use monitored                        anesthesia care (MAC). Immediately prior to                        administration of medications, the patient was                        re-assessed for adequacy to receive sedatives. The                        heart rate, respiratory rate, oxygen saturations, blood                        pressure, adequacy of pulmonary ventilation, and                        response to care were monitored throughout the                        procedure. The physical status of the patient was                        re-assessed after the procedure.                       After obtaining informed consent, the colonoscope was                        passed under direct vision. Throughout the procedure,                        the patient's blood pressure, pulse, and oxygen                        saturations were monitored continuously. The [place] [phone]) was introduced through the                        anus and advanced to the the terminal ileum. The  colonoscopy was performed without difficulty. The                        patient tolerated the procedure well. The quality of                        the bowel preparation was evaluated using the BBPS                        [facility] Bowel Preparation Scale) with scores of: Right                        Colon = 3, Transverse Colon = 3 and Left Colon = 3                        (entire mucosa seen well with no residual staining,                        small fragments of stool or opaque liquid). The total                        BBPS score equals 9. Findings:      The perianal and digital rectal  examinations were normal. Pertinent       negatives include normal sphincter tone and no palpable rectal lesions.      The terminal ileum appeared normal.      A 6 mm polyp was found in the ascending colon. The polyp was sessile.       The polyp was removed with a cold snare. Resection and retrieval were       complete.      A 6 mm polyp was found in the descending colon. The polyp was sessile.       The polyp was removed with a cold snare. Resection and retrieval were       complete.      A 4 mm polyp was found in the rectum. The polyp was hyperplastic. The       polyp was removed with a cold snare. Resection and retrieval were       complete.      A few small-mouthed diverticula were found in the sigmoid colon.      External hemorrhoids were found during retroflexion. The hemorrhoids       were medium-sized. Impression:           - The examined portion of the ileum was normal.                       - One 6 mm polyp in the ascending colon, removed with a                        cold snare. Resected and retrieved.                       - One 6 mm polyp in the descending colon, removed with                        a cold snare. Resected and retrieved.                       -  One 4 mm polyp in the rectum, removed with a cold                        snare. Resected and retrieved.                       - Diverticulosis in the sigmoid colon.                       - External hemorrhoids. Recommendation:       - Discharge patient to home (with escort).                       - Resume previous diet today.                       - Continue present medications.                       - Await pathology results.                       - Repeat colonoscopy in 5 years for surveillance based                        on pathology results. Procedure Code(s):    --- Professional ---                       [phone], Colonoscopy, flexible; with removal of tumor(s),                        polyp(s), or other lesion(s)  by snare technique Diagnosis Code(s):    --- Professional ---                       K64.4, Residual hemorrhoidal skin tags                       D12.2, Benign neoplasm of ascending colon                       D12.4, Benign neoplasm of descending colon                       K62.1, Rectal polyp                       R19.5, Other fecal abnormalities                       K57.30, Diverticulosis of large intestine without                        perforation or abscess without bleeding CPT copyright 2016 American Medical Association. All rights reserved. The codes documented in this report are preliminary and upon coder review may  be revised to meet current compliance requirements. Dr. Ulyess Mort Lin Landsman MD, MD 03/16/2017 9:06:39 AM This report has been signed electronically. Number of Addenda: 0 Note Initiated On: 03/16/2017 8:29 AM Scope Withdrawal Time: 0 hours 13 minutes 53 seconds  Total Procedure Duration: 0 hours 17 minutes 55 seconds       [facility]

## 2017-03-16 NOTE — Transfer of Care (Signed)
Immediate Anesthesia Transfer of Care Note  Patient: [name]  Procedure(s) Performed: COLONOSCOPY WITH PROPOFOL (N/A )  Patient Location: PACU  Anesthesia Type: General  Level of Consciousness: awake, alert  and patient cooperative  Airway and Oxygen Therapy: Patient Spontanous Breathing and Patient connected to supplemental oxygen  Post-op Assessment: Post-op Vital signs reviewed, Patient's Cardiovascular Status Stable, Respiratory Function Stable, Patent Airway and No signs of Nausea or vomiting  Post-op Vital Signs: Reviewed and stable  Complications: No apparent anesthesia complications

## 2017-03-16 NOTE — H&P (Signed)
Erin Darby, MD [address]  [place]  [place], [place] [address]  Main: [phone]  Fax: [phone] Pager: [phone]  Primary Care Physician:  Burnard Hawthorne, FNP Primary Gastroenterologist:  Dr. Cephas Christian  Pre-Procedure History & Physical: HPI:  [name] is a 56 y.o. female is here for an colonoscopy.   Past Medical History:  Diagnosis Date  . Cancer ([place])    skin  . Hypertension    readings    Past Surgical History:  Procedure Laterality Date  . BREAST CYST ASPIRATION Right 2002  . CYST REMOVAL TRUNK    . MOHS SURGERY  2002    Prior to Admission medications   Medication Sig Start Date End Date Taking? Authorizing Provider  amLODipine (NORVASC) 5 MG tablet Take 1 tablet (5 mg total) daily by mouth. 02/24/17  Yes Arnett, Yvetta Coder, FNP  meclizine (ANTIVERT) 25 MG tablet Take 1 tablet (25 mg total) by mouth 2 (two) times daily. As needed for vertigo 12/22/16  Yes Burnard Hawthorne, FNP    Allergies as of 03/08/2017  . (No Known Allergies)    Family History  Problem Relation Age of Onset  . Hypertension Mother   . Cancer Mother 23       bladder  . Hepatitis C Mother   . Cancer Father 32       bladder  . Stroke Maternal Grandmother   . Hypertension Maternal Grandmother   . Colon cancer Neg Hx     Social History   Socioeconomic History  . Marital status: Married    Spouse name: Not on file  . Number of children: Not on file  . Years of education: Not on file  . Highest education level: Not on file  Social Needs  . Financial resource strain: Not on file  . Food insecurity - worry: Not on file  . Food insecurity - inability: Not on file  . Transportation needs - medical: Not on file  . Transportation needs - non-medical: Not on file  Occupational History  . Not on file  Tobacco Use  . Smoking status: Never Smoker  . Smokeless tobacco: Never Used  Substance and Sexual Activity  . Alcohol use: Yes    Alcohol/week:  1.2 oz    Types: 2 Glasses of wine per week  . Drug use: No  . Sexual activity: Yes  Other Topics Concern  . 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   Married   Takes care of grandchildren.    2 daughters in [place], son in [place].      Diet-regular, low salt.    Exercise watching grandchildren   Caffeine-4cups a day                 Review of Systems: See HPI, otherwise negative ROS  Physical Exam: BP (!) 146/82   Pulse 96   Temp 99 F (37.2 C) (Temporal)   Ht 5\' 3"  (1.6 m)   Wt 167 lb (75.8 kg)   LMP 01/11/2015 (Approximate)   SpO2 100%   BMI 29.58 kg/m  General:   Alert,  pleasant and cooperative in NAD Head:  Normocephalic and atraumatic. Neck:  Supple; no masses or thyromegaly. Lungs:  Clear throughout to auscultation.    Heart:  Regular rate and rhythm. Abdomen:  Soft, nontender and nondistended. Normal bowel sounds, without guarding, and without rebound.   Neurologic:  Alert and  oriented x4;  grossly normal neurologically.  Impression/Plan: [name] is here for an colonoscopy to be performed for positive cologaurd test  Risks, benefits, limitations, and alternatives regarding  colonoscopy have been reviewed with the patient.  Questions have been answered.  All parties agreeable.   Sherri Sear, MD  03/16/2017, 7:58 AM

## 2017-03-16 NOTE — Anesthesia Preprocedure Evaluation (Signed)
Anesthesia Evaluation  Patient identified by MRN, date of birth, ID band Patient awake    History of Anesthesia Complications Negative for: history of anesthetic complications  Airway Mallampati: I  TM Distance: >3 FB Neck ROM: Full    Dental no notable dental hx.    Pulmonary neg pulmonary ROS,    Pulmonary exam normal breath sounds clear to auscultation       Cardiovascular Exercise Tolerance: Good hypertension, Normal cardiovascular exam Rhythm:Regular Rate:Normal     Neuro/Psych negative neurological ROS     GI/Hepatic negative GI ROS,   Endo/Other  negative endocrine ROS  Renal/GU negative Renal ROS     Musculoskeletal   Abdominal   Peds  Hematology negative hematology ROS (+)   Anesthesia Other Findings   Reproductive/Obstetrics                             Anesthesia Physical Anesthesia Plan  ASA: II  Anesthesia Plan: General   Post-op Pain Management:    Induction: Intravenous  PONV Risk Score and Plan: 2 and Propofol infusion  Airway Management Planned: Natural Airway  Additional Equipment:   Intra-op Plan:   Post-operative Plan:   Informed Consent: I have reviewed the patients History and Physical, chart, labs and discussed the procedure including the risks, benefits and alternatives for the proposed anesthesia with the patient or authorized representative who has indicated his/her understanding and acceptance.     Plan Discussed with: CRNA  Anesthesia Plan Comments:         Anesthesia Quick Evaluation

## 2017-03-18 ENCOUNTER — Encounter: Payer: Self-pay | Admitting: Gastroenterology

## 2017-03-29 ENCOUNTER — Telehealth: Payer: Self-pay | Admitting: Gastroenterology

## 2017-03-29 NOTE — Telephone Encounter (Signed)
Patient called wanting her biopsy results. 

## 2017-03-29 NOTE — Telephone Encounter (Signed)
Patient has been informed her pathology results have been reviewed by Dr. Bonna Gains.  Results were given as follows: 3 benign precancerous polyps were noted and we will repeat colonoscopy in 5 years.

## 2017-04-20 ENCOUNTER — Encounter: Payer: Self-pay | Admitting: Family

## 2017-04-20 DIAGNOSIS — I10 Essential (primary) hypertension: Secondary | ICD-10-CM

## 2017-04-20 MED ORDER — AMLODIPINE BESYLATE 10 MG PO TABS: 10.0000 mg | ORAL_TABLET | Freq: Every day | ORAL | 0 refills | Status: DC

## 2017-04-20 NOTE — Telephone Encounter (Signed)
Medication has been refilled.

## 2017-07-17 ENCOUNTER — Other Ambulatory Visit: Payer: Self-pay | Admitting: Family

## 2017-07-17 DIAGNOSIS — I10 Essential (primary) hypertension: Secondary | ICD-10-CM

## 2017-08-22 ENCOUNTER — Other Ambulatory Visit: Payer: Self-pay | Admitting: Family

## 2017-08-22 DIAGNOSIS — I10 Essential (primary) hypertension: Secondary | ICD-10-CM

## 2017-10-06 ENCOUNTER — Other Ambulatory Visit: Payer: Self-pay

## 2017-10-06 ENCOUNTER — Telehealth: Payer: Self-pay | Admitting: Family

## 2017-10-06 DIAGNOSIS — I10 Essential (primary) hypertension: Secondary | ICD-10-CM

## 2017-10-06 MED ORDER — AMLODIPINE BESYLATE 10 MG PO TABS: 10.0000 mg | ORAL_TABLET | Freq: Every day | ORAL | 0 refills | Status: DC

## 2017-10-06 NOTE — Telephone Encounter (Signed)
Rx refilled and sent to [facility] in [place]

## 2017-10-06 NOTE — Telephone Encounter (Unsigned)
Copied from [place] [phone]. Topic: Quick Communication - Rx Refill/Question >> Oct 06, 2017  1:51 PM Judyann Munson wrote: Medication: amLODipine (NORVASC) 10 MG tablet   Has the patient contacted their pharmacy? no Preferred Pharmacy :[id number] [place], [address] [phone] (Phone) [phone] (Fax)

## 2017-11-18 ENCOUNTER — Encounter: Payer: Commercial Managed Care - PPO | Admitting: Family

## 2017-11-22 ENCOUNTER — Other Ambulatory Visit: Payer: Self-pay | Admitting: Family

## 2017-11-22 DIAGNOSIS — Z1231 Encounter for screening mammogram for malignant neoplasm of breast: Secondary | ICD-10-CM

## 2018-01-16 ENCOUNTER — Encounter: Payer: Self-pay | Admitting: Family

## 2018-01-16 ENCOUNTER — Ambulatory Visit (INDEPENDENT_AMBULATORY_CARE_PROVIDER_SITE_OTHER): Payer: Commercial Managed Care - PPO | Admitting: Family

## 2018-01-16 VITALS — BP 144/86 | HR 87 | Temp 98.4°F | Resp 16 | Ht 63.0 in | Wt 179.5 lb

## 2018-01-16 DIAGNOSIS — Z0000 Encounter for general adult medical examination without abnormal findings: Secondary | ICD-10-CM | POA: Diagnosis not present

## 2018-01-16 DIAGNOSIS — Z6831 Body mass index (BMI) 31.0-31.9, adult: Secondary | ICD-10-CM | POA: Diagnosis not present

## 2018-01-16 DIAGNOSIS — I10 Essential (primary) hypertension: Secondary | ICD-10-CM

## 2018-01-16 DIAGNOSIS — Z23 Encounter for immunization: Secondary | ICD-10-CM | POA: Diagnosis not present

## 2018-01-16 DIAGNOSIS — E669 Obesity, unspecified: Secondary | ICD-10-CM | POA: Diagnosis not present

## 2018-01-16 LAB — TSH: TSH: 1.85 u[IU]/mL (ref 0.35–4.50)

## 2018-01-16 NOTE — Assessment & Plan Note (Signed)
Discussed lifestyle modifications including finding time to exercise, meal planning and not skipping meals.  Provided her with dietary education today.  She politely declines referral to medical weight loss, Redgie Grayer.

## 2018-01-16 NOTE — Assessment & Plan Note (Signed)
Clinical breast exam performed today.  In the absence of pelvic complaints today also for Pap smear is up-to-date, deferred pelvic exam.  Patient get screening labs done with her husband's employer.  She will bring these by our office.

## 2018-01-16 NOTE — Progress Notes (Signed)
Subjective:    Patient ID: [name], female    DOB: 16-Jan-1961, 57 y.o.   MRN: [id number]  CC: [name] is a 57 y.o. female who presents today for physical exam.    HPI: Overall, feels well.  No new complaints today.    She remains frustrated by weight.  sometimes eats late, or skips meals altogether.  No exercise at this time.  HTN- at home 135/76  Denies exertional chest pain or pressure, numbness or tingling radiating to left arm or jaw, palpitations, dizziness, frequent headaches, changes in vision, or shortness of breath.     Colorectal Cancer Screening: Cologuard positive 2018, colonoscopy 2018. Repeat in 5 years. Breast Cancer Screening: Mammogram due. Scheduled for tomorrow.  Cervical Cancer Screening: Unfollows with gyn; UTD. No pelvic pain.  Bone Health screening/DEXA for 65+: No increased fracture risk. Defer screening at this time. Lung Cancer Screening: Doesn't have 30 year pack year history and age > 67 years.       Tetanus - UTD         Hepatitis C screening - Candidate for, declines HIV Screening- Candidate for , declines Labs: Screening labs done prior Exercise: No  regular exercise.  Alcohol use: occasional Smoking/tobacco use: Nonsmoker.  Wears seat belt: Yes. Skin: no new lesions; no h/o skin cancer  HISTORY:  Past Medical History:  Diagnosis Date  . Cancer ([place])    skin  . Hypertension    readings    Past Surgical History:  Procedure Laterality Date  . BREAST CYST ASPIRATION Right 2002  . COLONOSCOPY WITH PROPOFOL N/A 03/16/2017   Procedure: COLONOSCOPY WITH PROPOFOL;  Surgeon: Lin Landsman, MD;  Location: [facility];  Service: Endoscopy;  Laterality: N/A;  . CYST REMOVAL TRUNK    . [place]  2002  . POLYPECTOMY  03/16/2017   Procedure: POLYPECTOMY;  Surgeon: Lin Landsman, MD;  Location: [facility];  Service: Endoscopy;;   Family History  Problem Relation Age of Onset  . Hypertension Mother     . Cancer Mother 93       bladder  . Hepatitis C Mother   . Cancer Father 90       bladder  . Stroke Maternal Grandmother   . Hypertension Maternal Grandmother   . Colon cancer Neg Hx       ALLERGIES: Patient has no known allergies.  Current Outpatient Medications on File Prior to Visit  Medication Sig Dispense Refill  . amLODipine (NORVASC) 10 MG tablet Take 1 tablet (10 mg total) by mouth daily. 90 tablet 0  . meclizine (ANTIVERT) 25 MG tablet Take 1 tablet (25 mg total) by mouth 2 (two) times daily. As needed for vertigo 30 tablet 0   No current facility-administered medications on file prior to visit.     Social History   Tobacco Use  . Smoking status: Never Smoker  . Smokeless tobacco: Never Used  Substance Use Topics  . Alcohol use: Yes    Alcohol/week: 2.0 standard drinks    Types: 2 Glasses of wine per week  . Drug use: No    Review of Systems  Constitutional: Negative for chills, fever and unexpected weight change.  HENT: Negative for congestion.   Respiratory: Negative for cough.   Cardiovascular: Negative for chest pain, palpitations and leg swelling.  Gastrointestinal: Negative for nausea and vomiting.  Genitourinary: Negative for dyspareunia and vaginal bleeding.  Musculoskeletal: Negative for arthralgias and myalgias.  Skin: Negative for rash.  Neurological: Negative for headaches.  Hematological: Negative for adenopathy.  Psychiatric/Behavioral: Negative for confusion.      Objective:    BP (!) 144/86 (BP Location: Left Arm, Patient Position: Sitting, Cuff Size: Normal)   Pulse 87   Temp 98.4 F (36.9 C) (Oral)   Resp 16   Ht 5\' 3"  (1.6 m)   Wt 179 lb 8 oz (81.4 kg)   LMP 01/11/2015 (Approximate)   SpO2 98%   BMI 31.80 kg/m   BP Readings from Last 3 Encounters:  01/16/18 (!) 144/86  03/16/17 (!) 125/92  01/07/17 128/88   Wt Readings from Last 3 Encounters:  01/16/18 179 lb 8 oz (81.4 kg)  03/16/17 167 lb (75.8 kg)  03/08/17 173 lb  6.4 oz (78.7 kg)    Physical Exam  Constitutional: She appears well-developed and well-nourished.  Eyes: Conjunctivae are normal.  Neck: No thyroid mass and no thyromegaly present.  Cardiovascular: Normal rate, regular rhythm, normal heart sounds and normal pulses.  Pulmonary/Chest: Effort normal and breath sounds normal. She has no wheezes. She has no rhonchi. She has no rales. Right breast exhibits no inverted nipple, no mass, no nipple discharge, no skin change and no tenderness. Left breast exhibits no inverted nipple, no mass, no nipple discharge, no skin change and no tenderness. Breasts are symmetrical.  CBE performed.   Lymphadenopathy:       Head (right side): No submental, no submandibular, no tonsillar, no preauricular, no posterior auricular and no occipital adenopathy present.       Head (left side): No submental, no submandibular, no tonsillar, no preauricular, no posterior auricular and no occipital adenopathy present.    She has no cervical adenopathy.       Right cervical: No superficial cervical, no deep cervical and no posterior cervical adenopathy present.      Left cervical: No superficial cervical, no deep cervical and no posterior cervical adenopathy present.    She has no axillary adenopathy.  Neurological: She is alert.  Skin: Skin is warm and dry.  Psychiatric: She has a normal mood and affect. Her speech is normal and behavior is normal. Thought content normal.  Vitals reviewed.      Assessment & Plan:   Problem List Items Addressed This Visit      Cardiovascular and Mediastinum   Essential hypertension, benign    Slightly elevated today.  Patient a little reluctant to start this medication to amlodipine.  She would like to send  blood pressure readings from home.  Advised her if is consistently greater than 130/80, would need to start another drug class in addition to amlodipine.  Patient verbalized understanding of this.        Other   Obesity     Discussed lifestyle modifications including finding time to exercise, meal planning and not skipping meals.  Provided her with dietary education today.  She politely declines referral to medical weight loss, Redgie Grayer.      Routine physical examination - Primary    Clinical breast exam performed today.  In the absence of pelvic complaints today also for Pap smear is up-to-date, deferred pelvic exam.  Patient get screening labs done with her husband's employer.  She will bring these by our office.      Relevant Orders   TSH    Other Visit Diagnoses    Need for immunization against influenza       Relevant Orders   Flu Vaccine QUAD 36+ mos IM (Completed)  I am having Kienna Moncada maintain her meclizine and amLODipine.   No orders of the defined types were placed in this encounter.   Return precautions given.   Risks, benefits, and alternatives of the medications and treatment plan prescribed today were discussed, and patient expressed understanding.   Education regarding symptom management and diagnosis given to patient on AVS.   Continue to follow with Burnard Hawthorne, FNP for routine health maintenance.   Jodi Geralds and I agreed with plan.   Mable Paris, FNP

## 2018-01-16 NOTE — Patient Instructions (Addendum)
Bring your labs from this year at your  Husband's work. We will do thyroid labs today since not done.    Monitor blood pressure,  Goal is less than 120/80; if persistently higher, please make sooner follow up appointment so we can recheck you blood pressure and manage medications  This is  Dr. Lupita Dawn  example of a  "Low GI"  Diet:  It will allow you to lose 4 to 8  lbs  per month if you follow it carefully.  Your goal with exercise is a minimum of 30 minutes of aerobic exercise 5 days per week (Walking does not count once it becomes easy!)    All of the foods can be found at grocery stores and in bulk at [employer].  The Atkins protein bars and shakes are available in more varieties at [employer], [employer] and [place].     7 AM Breakfast:  Choose from the following:  Low carbohydrate Protein  Shakes (I recommend the  Premier Protein chocolate shakes,  EAS AdvantEdge "Carb Control" shakes  Or the Atkins shakes all are under 3 net carbs)     a scrambled egg/bacon/cheese burrito made with Mission's "carb balance" whole wheat tortilla  (about 10 net carbs )  Regulatory affairs officer (basically a quiche without the pastry crust) that is eaten cold and very convenient way to get your eggs.  8 carbs)  If you make your own protein shakes, avoid bananas and pineapple,  And use low carb greek yogurt or original /unsweetened almond or soy milk    Avoid cereal and bananas, oatmeal and cream of wheat and grits. They are loaded with carbohydrates!   10 AM: high protein snack:  Protein bar by Atkins (the snack size, under 200 cal, usually < 6 net carbs).    A stick of cheese:  Around 1 carb,  100 cal     Dannon Light n Fit [place] Yogurt  (80 cal, 8 carbs)  Other so called "protein bars" and Greek yogurts tend to be loaded with carbohydrates.  Remember, in food advertising, the word "energy" is synonymous for " carbohydrate."  Lunch:   A Sandwich using the bread choices listed, Can use any   Eggs,  lunchmeat, grilled meat or canned tuna), avocado, regular mayo/mustard  and cheese.  A Salad using blue cheese, ranch,  Goddess or vinagrette,  Avoid taco shells, croutons or "confetti" and no "candied nuts" but regular nuts OK.   No pretzels, nabs  or chips.  Pickles and miniature sweet peppers are a good low carb alternative that provide a "crunch"  The bread is the only source of carbohydrate in a sandwich and  can be decreased by trying some of the attached alternatives to traditional loaf bread   Avoid "Low fat dressings, as well as [place] dressings They are loaded with sugar!   3 PM/ Mid day  Snack:  Consider  1 ounce of  almonds, walnuts, pistachios, pecans, peanuts,  Macadamia nuts or a nut medley.  Avoid "granola and granola bars "  Mixed nuts are ok in moderation as long as there are no raisins,  cranberries or dried fruit.   KIND bars are OK if you get the low glycemic index variety   Try the prosciutto/mozzarella cheese sticks by [employer]  In deli /backery section   High protein      6 PM  Dinner:     Meat/fowl/fish with a green salad, and either  broccoli, cauliflower, green beans, spinach, brussel sprouts or  Lima beans. DO NOT BREAD THE PROTEIN!!      There is a low carb pasta by Dreamfield's that is acceptable and tastes great: only 5 digestible carbs/serving.( All grocery stores but BJs carry it ) Several ready made meals are available low carb:   Try Michel Angelo's chicken piccata or chicken or eggplant parm over low carb pasta.(Lowes and BJs)   Marjory Lies Sanchez's "Carnitas" (pulled pork, no sauce,  0 carbs) or his beef pot roast to make a dinner burrito (at [employer]J's)  Pesto over low carb pasta (bj's sells a good quality pesto in the center refrigerated section of the deli   Try satueeing  Cheral Marker with mushroooms as a good side   [employer] Giant makes a mashed cauliflower that tastes like mashed potatoes  Whole wheat pasta is still full of digestible  carbs and  Not as low in glycemic index as Dreamfield's.   Brown rice is still rice,  So skip the rice and noodles if you eat [place] or [place] (or at least limit to 1/2 cup)  9 PM snack :   Breyer's "low carb" fudgsicle or  ice cream bar (Carb Smart line), or  Weight Watcher's ice cream bar , or another "no sugar added" ice cream;  a serving of fresh berries/cherries with whipped cream   Cheese or DANNON'S LlGHT N FIT GREEK YOGURT  8 ounces of [place] unsweetened almond/cococunut milk    Treat yourself to a parfait made with whipped cream blueberiies, walnuts and vanilla greek yogurt  Avoid bananas, pineapple, grapes  and watermelon on a regular basis because they are high in sugar.  THINK OF THEM AS DESSERT  Remember that snack Substitutions should be less than 10 NET carbs per serving and meals < 20 carbs. Remember to subtract fiber grams to get the "net carbs."  @TULLOBREADPACKAGE @

## 2018-01-16 NOTE — Assessment & Plan Note (Signed)
Slightly elevated today.  Patient a little reluctant to start this medication to amlodipine.  She would like to send  blood pressure readings from home.  Advised her if is consistently greater than 130/80, would need to start another drug class in addition to amlodipine.  Patient verbalized understanding of this.

## 2018-01-17 ENCOUNTER — Ambulatory Visit
Admission: RE | Admit: 2018-01-17 | Discharge: 2018-01-17 | Disposition: A | Discharge status: Home / Self Care | Payer: Commercial Managed Care - PPO | Source: Ambulatory Visit | Attending: Family | Admitting: Family

## 2018-01-17 DIAGNOSIS — Z1231 Encounter for screening mammogram for malignant neoplasm of breast: Secondary | ICD-10-CM | POA: Diagnosis not present

## 2018-05-15 ENCOUNTER — Encounter: Payer: Self-pay | Admitting: Family

## 2018-05-19 ENCOUNTER — Other Ambulatory Visit: Payer: Self-pay

## 2018-05-19 ENCOUNTER — Other Ambulatory Visit: Payer: Self-pay | Admitting: Family

## 2018-05-19 DIAGNOSIS — I10 Essential (primary) hypertension: Secondary | ICD-10-CM

## 2018-05-19 MED ORDER — LISINOPRIL 5 MG PO TABS: 5.0000 mg | ORAL_TABLET | Freq: Every day | ORAL | 3 refills | Status: DC

## 2018-05-19 NOTE — Progress Notes (Signed)
Sent mychart

## 2018-05-24 ENCOUNTER — Other Ambulatory Visit: Payer: Self-pay

## 2018-05-24 DIAGNOSIS — I10 Essential (primary) hypertension: Secondary | ICD-10-CM

## 2018-05-24 MED ORDER — AMLODIPINE BESYLATE 10 MG PO TABS: 10.0000 mg | ORAL_TABLET | Freq: Every day | ORAL | 0 refills | Status: DC

## 2018-06-08 ENCOUNTER — Other Ambulatory Visit (INDEPENDENT_AMBULATORY_CARE_PROVIDER_SITE_OTHER): Payer: Commercial Managed Care - PPO

## 2018-06-08 DIAGNOSIS — I10 Essential (primary) hypertension: Secondary | ICD-10-CM

## 2018-06-08 LAB — BASIC METABOLIC PANEL
BUN: 16 mg/dL (ref 6–23)
CHLORIDE: 102 meq/L (ref 96–112)
CO2: 27 mEq/L (ref 19–32)
Calcium: 9.3 mg/dL (ref 8.4–10.5)
Creatinine, Ser: 0.8 mg/dL (ref 0.40–1.20)
GFR: 73.76 mL/min (ref >60.00)
Glucose, Bld: 97 mg/dL (ref 70–99)
POTASSIUM: 3.9 meq/L (ref 3.5–5.1)
Sodium: 139 mEq/L (ref 135–145)

## 2018-08-17 ENCOUNTER — Other Ambulatory Visit: Payer: Self-pay | Admitting: Family

## 2018-08-17 DIAGNOSIS — I10 Essential (primary) hypertension: Secondary | ICD-10-CM

## 2018-11-17 ENCOUNTER — Other Ambulatory Visit: Payer: Self-pay | Admitting: Family

## 2018-11-17 DIAGNOSIS — I10 Essential (primary) hypertension: Secondary | ICD-10-CM

## 2018-11-27 ENCOUNTER — Telehealth: Payer: Self-pay | Admitting: *Deleted

## 2018-11-27 NOTE — Telephone Encounter (Signed)
Copied from [place] [id number]. Topic: Appointment Scheduling - Scheduling Inquiry for Clinic >> Nov 27, 2018  9:27 AM Rayann Heman wrote: Reason for CRM: pt called and stated that she needs to schedule a physical before the end of October because of insurance.  No available appointment until 03/2019

## 2019-02-09 ENCOUNTER — Other Ambulatory Visit: Payer: Self-pay | Admitting: Family

## 2019-02-09 DIAGNOSIS — I10 Essential (primary) hypertension: Secondary | ICD-10-CM

## 2019-02-13 ENCOUNTER — Other Ambulatory Visit: Payer: Self-pay | Admitting: Family

## 2019-02-13 DIAGNOSIS — I10 Essential (primary) hypertension: Secondary | ICD-10-CM

## 2019-04-02 ENCOUNTER — Encounter: Payer: Self-pay | Admitting: Family

## 2019-04-02 ENCOUNTER — Telehealth: Payer: Self-pay | Admitting: Family

## 2019-04-02 ENCOUNTER — Ambulatory Visit (INDEPENDENT_AMBULATORY_CARE_PROVIDER_SITE_OTHER): Payer: Self-pay | Admitting: Family

## 2019-04-02 ENCOUNTER — Other Ambulatory Visit: Payer: Self-pay

## 2019-04-02 VITALS — BP 134/82 | Ht 63.0 in | Wt 179.0 lb

## 2019-04-02 DIAGNOSIS — Z1231 Encounter for screening mammogram for malignant neoplasm of breast: Secondary | ICD-10-CM

## 2019-04-02 DIAGNOSIS — I10 Essential (primary) hypertension: Secondary | ICD-10-CM

## 2019-04-02 MED ORDER — LISINOPRIL 10 MG PO TABS
10.0000 mg | ORAL_TABLET | Freq: Every day | ORAL | 3 refills | Status: DC
Start: 2019-04-02 — End: 2020-09-10

## 2019-04-02 NOTE — Progress Notes (Signed)
Virtual Visit via Video Note  I connected with@  on 04/02/19 at 11:00 AM EST by a video enabled telemedicine application and verified that I am speaking with the correct person using two identifiers.  Location patient: home Location provider: home office Persons participating in the virtual visit: patient, provider  I discussed the limitations of evaluation and management by telemedicine and the availability of in person appointments. The patient expressed understanding and agreed to proceed.   HPI:  Feeling well. No complaints.  Working at [employer] and has a very active position.  Walking around a lot going up and down ladders. No SOB, CP, leg swelling.   HTN- feels like fluctuate. On average 130/80.  Compliant with medications.  Due for mammogram.   ROS: See pertinent positives and negatives per HPI.  Past Medical History:  Diagnosis Date  . Cancer ([place])    skin  . Hypertension    readings    Past Surgical History:  Procedure Laterality Date  . BREAST CYST ASPIRATION Right 2002  . COLONOSCOPY WITH PROPOFOL N/A 03/16/2017   Procedure: COLONOSCOPY WITH PROPOFOL;  Surgeon: Lin Landsman, MD;  Location: [facility];  Service: Endoscopy;  Laterality: N/A;  . CYST REMOVAL TRUNK    . [place]  2002  . POLYPECTOMY  03/16/2017   Procedure: POLYPECTOMY;  Surgeon: Lin Landsman, MD;  Location: [facility];  Service: Endoscopy;;    Family History  Problem Relation Age of Onset  . Hypertension Mother   . Cancer Mother 73       bladder  . Hepatitis C Mother   . Cancer Father 33       bladder  . Stroke Maternal Grandmother   . Hypertension Maternal Grandmother   . Colon cancer Neg Hx   . Breast cancer Neg Hx     SOCIAL HX: never smoker  Current Outpatient Medications:  .  amLODipine (NORVASC) 10 MG tablet, TAKE 1 TABLET BY MOUTH EVERY DAY, Disp: 90 tablet, Rfl: 0 .  lisinopril (ZESTRIL) 10 MG tablet, Take 1 tablet (10 mg total) by mouth  daily., Disp: 90 tablet, Rfl: 3  EXAM:  VITALS per patient if applicable: Vitals:   [id number]  BP: 134/82    GENERAL: alert, oriented, appears well and in no acute distress  HEENT: atraumatic, conjunttiva clear, no obvious abnormalities on inspection of external nose and ears  NECK: normal movements of the head and neck  LUNGS: on inspection no signs of respiratory distress, breathing rate appears normal, no obvious gross SOB, gasping or wheezing  CV: no obvious cyanosis  MS: moves all visible extremities without noticeable abnormality  PSYCH/NEURO: pleasant and cooperative, no obvious depression or anxiety, speech and thought processing grossly intact  ASSESSMENT AND PLAN:  Discussed the following assessment and plan:  Essential hypertension, benign - Plan: lisinopril (ZESTRIL) 10 MG tablet, Comprehensive metabolic panel-FUTURE  Screening mammogram, encounter for - Plan: 3D mammogram- MM SCREENING BREAST TOMO BILATERAL Problem List Items Addressed This Visit      Cardiovascular and Mediastinum   Essential hypertension, benign - Primary    Slightly elevated. Increase lisinopril. Patient will monitor and let me know if not at goal.  Repeat CMP 7-10  days after increase.       Relevant Medications   lisinopril (ZESTRIL) 10 MG tablet   Other Relevant Orders   Comprehensive metabolic panel-FUTURE    Other Visit Diagnoses    Screening mammogram, encounter for  Relevant Orders   3D mammogram- MM SCREENING BREAST TOMO BILATERAL     Of note, advised patient she is due for mammogram.  She politely declines to schedule since Covid is so high.  I placed order and patient understands she may call and schedule early in the new year when she is comfortable -we discussed possible serious and likely etiologies, options for evaluation and workup, limitations of telemedicine visit vs in person visit, treatment, treatment risks and precautions. Pt prefers to treat via  telemedicine empirically rather then risking or undertaking an in person visit at this moment. Patient agrees to seek prompt in person care if worsening, new symptoms arise, or if is not improving with treatment.   I discussed the assessment and treatment plan with the patient. The patient was provided an opportunity to ask questions and all were answered. The patient agreed with the plan and demonstrated an understanding of the instructions.   The patient was advised to call back or seek an in-person evaluation if the symptoms worsen or if the condition fails to improve as anticipated.   Mable Paris, FNP

## 2019-04-02 NOTE — Assessment & Plan Note (Signed)
Slightly elevated. Increase lisinopril. Patient will monitor and let me know if not at goal.  Repeat CMP 7-10  days after increase.

## 2019-04-02 NOTE — Telephone Encounter (Signed)
Lm to call office to set up lab in 7-10 after adjusting her lisinopril. Also needs a 3 to 6 month follow up.

## 2019-04-04 ENCOUNTER — Ambulatory Visit: Payer: Commercial Managed Care - PPO | Admitting: Family

## 2019-04-27 ENCOUNTER — Encounter: Payer: Self-pay | Admitting: Family

## 2019-05-09 ENCOUNTER — Other Ambulatory Visit: Payer: Self-pay | Admitting: Family

## 2019-05-09 DIAGNOSIS — I10 Essential (primary) hypertension: Secondary | ICD-10-CM

## 2019-05-12 ENCOUNTER — Other Ambulatory Visit: Payer: Self-pay | Admitting: Family

## 2019-05-12 DIAGNOSIS — I10 Essential (primary) hypertension: Secondary | ICD-10-CM

## 2019-08-22 ENCOUNTER — Telehealth: Payer: Self-pay | Admitting: Family

## 2019-08-22 DIAGNOSIS — I10 Essential (primary) hypertension: Secondary | ICD-10-CM

## 2019-08-22 MED ORDER — AMLODIPINE BESYLATE 10 MG PO TABS: 10.0000 mg | ORAL_TABLET | Freq: Every day | ORAL | 1 refills | Status: DC

## 2019-08-22 NOTE — Telephone Encounter (Signed)
Pt needs refill on amlodipine. She is currently out of medication.

## 2019-11-02 ENCOUNTER — Encounter: Payer: Self-pay | Admitting: Family

## 2019-12-21 ENCOUNTER — Other Ambulatory Visit: Payer: Self-pay

## 2019-12-21 ENCOUNTER — Encounter: Payer: Self-pay | Admitting: Family

## 2019-12-21 ENCOUNTER — Ambulatory Visit (INDEPENDENT_AMBULATORY_CARE_PROVIDER_SITE_OTHER): Payer: BC Managed Care – PPO | Admitting: Family

## 2019-12-21 VITALS — BP 122/76 | HR 86 | Temp 99.1°F | Ht 63.0 in | Wt 178.0 lb

## 2019-12-21 DIAGNOSIS — I10 Essential (primary) hypertension: Secondary | ICD-10-CM

## 2019-12-21 DIAGNOSIS — R42 Dizziness and giddiness: Secondary | ICD-10-CM

## 2019-12-21 DIAGNOSIS — Z1231 Encounter for screening mammogram for malignant neoplasm of breast: Secondary | ICD-10-CM | POA: Diagnosis not present

## 2019-12-21 DIAGNOSIS — Z8052 Family history of malignant neoplasm of bladder: Secondary | ICD-10-CM | POA: Diagnosis not present

## 2019-12-21 DIAGNOSIS — Z23 Encounter for immunization: Secondary | ICD-10-CM

## 2019-12-21 LAB — URINALYSIS, ROUTINE W REFLEX MICROSCOPIC
Bilirubin Urine: NEGATIVE
Ketones, ur: NEGATIVE
Nitrite: NEGATIVE
Specific Gravity, Urine: 1.02 (ref 1.000–1.030)
Total Protein, Urine: NEGATIVE
Urine Glucose: NEGATIVE
Urobilinogen, UA: 0.2 (ref 0.0–1.0)
pH: 5.5 (ref 5.0–8.0)

## 2019-12-21 LAB — COMPREHENSIVE METABOLIC PANEL
ALT: 10 U/L (ref 0–35)
AST: 12 U/L (ref 0–37)
Albumin: 4.7 g/dL (ref 3.5–5.2)
Alkaline Phosphatase: 75 U/L (ref 39–117)
BUN: 17 mg/dL (ref 6–23)
CO2: 28 mEq/L (ref 19–32)
Calcium: 9.4 mg/dL (ref 8.4–10.5)
Chloride: 102 mEq/L (ref 96–112)
Creatinine, Ser: 0.83 mg/dL (ref 0.40–1.20)
GFR: 70.31 mL/min (ref >60.00)
Glucose, Bld: 111 mg/dL — ABNORMAL HIGH (ref 70–99)
Potassium: 4.1 mEq/L (ref 3.5–5.1)
Sodium: 138 mEq/L (ref 135–145)
Total Bilirubin: 0.7 mg/dL (ref 0.2–1.2)
Total Protein: 7.6 g/dL (ref 6.0–8.3)

## 2019-12-21 LAB — VITAMIN D 25 HYDROXY (VIT D DEFICIENCY, FRACTURES): VITD: 26.88 ng/mL — ABNORMAL LOW (ref 30.00–100.00)

## 2019-12-21 LAB — CBC WITH DIFFERENTIAL/PLATELET
Basophils Absolute: 0 10*3/uL (ref 0.0–0.1)
Basophils Relative: 0.2 % (ref 0.0–3.0)
Eosinophils Absolute: 0.1 10*3/uL (ref 0.0–0.7)
Eosinophils Relative: 1.8 % (ref 0.0–5.0)
HCT: 38.4 % (ref 36.0–46.0)
Hemoglobin: 13.1 g/dL (ref 12.0–15.0)
Lymphocytes Relative: 22.8 % (ref 12.0–46.0)
Lymphs Abs: 1.9 10*3/uL (ref 0.7–4.0)
MCHC: 34 g/dL (ref 30.0–36.0)
MCV: 90.4 fl (ref 78.0–100.0)
Monocytes Absolute: 0.5 10*3/uL (ref 0.1–1.0)
Monocytes Relative: 5.8 % (ref 3.0–12.0)
Neutro Abs: 5.9 10*3/uL (ref 1.4–7.7)
Neutrophils Relative %: 69.4 % (ref 43.0–77.0)
Platelets: 266 10*3/uL (ref 150.0–400.0)
RBC: 4.25 Mil/uL (ref 3.87–5.11)
RDW: 13.6 % (ref 11.5–15.5)
WBC: 8.5 10*3/uL (ref 4.0–10.5)

## 2019-12-21 LAB — HEMOGLOBIN A1C: Hgb A1c MFr Bld: 5.9 % (ref 4.6–6.5)

## 2019-12-21 LAB — TSH: TSH: 1.78 u[IU]/mL (ref 0.35–4.50)

## 2019-12-21 MED ORDER — MECLIZINE HCL 12.5 MG PO TABS: 12.5000 mg | ORAL_TABLET | Freq: Three times a day (TID) | ORAL | 0 refills | Status: DC | PRN

## 2019-12-21 NOTE — Progress Notes (Signed)
Subjective:    Patient ID: [name], female    DOB: 09-Dec-1960, 59 y.o.   MRN: [id number]  CC: [name] is a 59 y.o. female who presents today for follow up.   HPI: Feels well today. No complaints  HTN- compliant with lisinopril, amlodipine.   Denies exertional chest pain or pressure, numbness or tingling radiating to left arm or jaw, palpitations,or shortness of breath.   H/o vertigo and would like refill of meclizine.  Episodes are rare. No HA, vision changes, syncope, hearing loss, tinnitus. Thinks vertigo is triggered stress.  Drinks plenty of water.      Due for mammogram, pap smear; follows with [facility] . HISTORY:  Past Medical History:  Diagnosis Date  . Cancer ([place])    skin  . Hypertension    readings   Past Surgical History:  Procedure Laterality Date  . BREAST CYST ASPIRATION Right 2002  . COLONOSCOPY WITH PROPOFOL N/A 03/16/2017   Procedure: COLONOSCOPY WITH PROPOFOL;  Surgeon: Lin Landsman, MD;  Location: [facility];  Service: Endoscopy;  Laterality: N/A;  . CYST REMOVAL TRUNK    . [place]  2002  . POLYPECTOMY  03/16/2017   Procedure: POLYPECTOMY;  Surgeon: Lin Landsman, MD;  Location: [facility];  Service: Endoscopy;;   Family History  Problem Relation Age of Onset  . Hypertension Mother   . Cancer Mother 3       bladder  . Hepatitis C Mother   . Cancer Father 55       bladder  . Stroke Maternal Grandmother   . Hypertension Maternal Grandmother   . Colon cancer Neg Hx   . Breast cancer Neg Hx     Allergies: Patient has no known allergies. Current Outpatient Medications on File Prior to Visit  Medication Sig Dispense Refill  . amLODipine (NORVASC) 10 MG tablet Take 1 tablet (10 mg total) by mouth daily. 90 tablet 1  . lisinopril (ZESTRIL) 10 MG tablet Take 1 tablet (10 mg total) by mouth daily. 90 tablet 3   No current facility-administered medications on file prior to visit.    Social  History   Tobacco Use  . Smoking status: Never Smoker  . Smokeless tobacco: Never Used  Vaping Use  . Vaping Use: Never used  Substance Use Topics  . Alcohol use: Yes    Alcohol/week: 2.0 standard drinks    Types: 2 Glasses of wine per week  . Drug use: No    Review of Systems  Constitutional: Negative for chills and fever.  Eyes: Negative for visual disturbance.  Respiratory: Negative for cough.   Cardiovascular: Negative for chest pain and palpitations.  Gastrointestinal: Negative for nausea and vomiting.  Neurological: Negative for headaches.      Objective:    BP 122/76 (BP Location: Left Arm, Patient Position: Sitting, Cuff Size: Large)   Pulse 86   Temp 99.1 F (37.3 C)   Ht 5\' 3"  (1.6 m)   Wt 178 lb (80.7 kg)   LMP 01/11/2015 (Approximate)   SpO2 98%   BMI 31.53 kg/m  BP Readings from Last 3 Encounters:  12/21/19 122/76  04/02/19 134/82  01/16/18 (!) 144/86   Wt Readings from Last 3 Encounters:  12/21/19 178 lb (80.7 kg)  04/02/19 179 lb (81.2 kg)  01/16/18 179 lb 8 oz (81.4 kg)    Physical Exam Vitals reviewed.  Constitutional:      Appearance: She is well-developed.  Eyes:  Conjunctiva/sclera: Conjunctivae normal.  Cardiovascular:     Rate and Rhythm: Normal rate and regular rhythm.     Pulses: Normal pulses.     Heart sounds: Normal heart sounds.  Pulmonary:     Effort: Pulmonary effort is normal.     Breath sounds: Normal breath sounds. No wheezing, rhonchi or rales.  Skin:    General: Skin is warm and dry.  Neurological:     Mental Status: She is alert.  Psychiatric:        Speech: Speech normal.        Behavior: Behavior normal.        Thought Content: Thought content normal.        Assessment & Plan:   Problem List Items Addressed This Visit      Cardiovascular and Mediastinum   Essential hypertension, benign    Stable, continue regimen.       Relevant Orders   TSH   CBC with Differential/Platelet   Comprehensive  metabolic panel   Hemoglobin A1c   VITAMIN D 25 Hydroxy (Vit-D Deficiency, Fractures)     Other   Family history of bladder cancer    Pending UA. Patient declines referral to urology for further guidance, surveillance due to family history. She will ask me for UA annually.       Relevant Orders   Urinalysis, Routine w reflex microscopic   Vertigo    Stable , chronic. Refilled meclizine.       Relevant Medications   meclizine (ANTIVERT) 12.5 MG tablet    Other Visit Diagnoses    Need for immunization against influenza    -  Primary   Relevant Orders   Flu Vaccine QUAD 36+ mos IM (Completed)   Encounter for screening mammogram for malignant neoplasm of breast       Relevant Orders   MM 3D SCREEN BREAST BILATERAL    patient will schedule mammogram   I am having [name] start on meclizine. I am also having her maintain her lisinopril and amLODipine.   Meds ordered this encounter  Medications  . meclizine (ANTIVERT) 12.5 MG tablet    Sig: Take 1 tablet (12.5 mg total) by mouth 3 (three) times daily as needed for dizziness.    Dispense:  30 tablet    Refill:  0    Order Specific Question:   Supervising Provider    Answer:   Crecencio Mc [[id number]]    Return precautions given.   Risks, benefits, and alternatives of the medications and treatment plan prescribed today were discussed, and patient expressed understanding.   Education regarding symptom management and diagnosis given to patient on AVS.  Continue to follow with Burnard Hawthorne, FNP for routine health maintenance.   [name] and I agreed with plan.   Mable Paris, FNP

## 2019-12-21 NOTE — Assessment & Plan Note (Signed)
Pending UA. Patient declines referral to urology for further guidance, surveillance due to family history. She will ask me for UA annually.

## 2019-12-21 NOTE — Assessment & Plan Note (Signed)
Stable , chronic. Refilled meclizine.

## 2019-12-21 NOTE — Patient Instructions (Addendum)
Please obtain cholesterol with husband's work.   Please call  and schedule your 3D mammogram   [facility]  [place], [place]   Nice to see you!

## 2019-12-21 NOTE — Assessment & Plan Note (Signed)
Stable, continue regimen. 

## 2020-01-11 ENCOUNTER — Ambulatory Visit
Admission: RE | Admit: 2020-01-11 | Discharge: 2020-01-11 | Disposition: A | Discharge status: Home / Self Care | Payer: BC Managed Care – PPO | Source: Ambulatory Visit | Attending: Family | Admitting: Family

## 2020-01-11 ENCOUNTER — Encounter: Payer: Self-pay | Admitting: Obstetrics and Gynecology

## 2020-01-11 ENCOUNTER — Other Ambulatory Visit: Payer: Self-pay

## 2020-01-11 ENCOUNTER — Ambulatory Visit (INDEPENDENT_AMBULATORY_CARE_PROVIDER_SITE_OTHER): Payer: BC Managed Care – PPO | Admitting: Obstetrics and Gynecology

## 2020-01-11 VITALS — BP 138/80 | Ht 63.0 in | Wt 177.0 lb

## 2020-01-11 DIAGNOSIS — Z01419 Encounter for gynecological examination (general) (routine) without abnormal findings: Secondary | ICD-10-CM

## 2020-01-11 DIAGNOSIS — Z1231 Encounter for screening mammogram for malignant neoplasm of breast: Secondary | ICD-10-CM | POA: Insufficient documentation

## 2020-01-11 DIAGNOSIS — Z1339 Encounter for screening examination for other mental health and behavioral disorders: Secondary | ICD-10-CM

## 2020-01-11 DIAGNOSIS — Z1331 Encounter for screening for depression: Secondary | ICD-10-CM | POA: Diagnosis not present

## 2020-01-11 LAB — HM PAP SMEAR: HM Pap smear: NEGATIVE

## 2020-01-11 NOTE — Progress Notes (Signed)
Routine Annual Gynecology Examination   PCP: Burnard Hawthorne, FNP  Chief Complaint  Patient presents with  . Gynecologic Exam   History of Present Illness: Patient is a 59 y.o. G3P3003 presents for annual exam. The patient has no complaints today.   Menopausal bleeding: denies  Menopausal symptoms: denies  Breast symptoms: denies  Last pap smear: 3 years ago.  Result Normal  Last mammogram: today.  Result Normal   Last colonoscopy: 2018, 5-year follow up  Past Medical History:  Diagnosis Date  . Cancer ([place])    skin  . Hypertension    readings    Past Surgical History:  Procedure Laterality Date  . BREAST CYST ASPIRATION Right 2002  . COLONOSCOPY WITH PROPOFOL N/A 03/16/2017   Procedure: COLONOSCOPY WITH PROPOFOL;  Surgeon: Lin Landsman, MD;  Location: [facility];  Service: Endoscopy;  Laterality: N/A;  . CYST REMOVAL TRUNK    . [place]  2002  . POLYPECTOMY  03/16/2017   Procedure: POLYPECTOMY;  Surgeon: Lin Landsman, MD;  Location: [facility];  Service: Endoscopy;;    Prior to Admission medications   Medication Sig Start Date End Date Taking? Authorizing Provider  amLODipine (NORVASC) 10 MG tablet Take 1 tablet (10 mg total) by mouth daily. 08/22/19  Yes Arnett, Yvetta Coder, FNP  lisinopril (ZESTRIL) 10 MG tablet Take 1 tablet (10 mg total) by mouth daily. 04/02/19  Yes Burnard Hawthorne, FNP   Allergies: No Known Allergies  Obstetric History: [id number]  Social History   Socioeconomic History  . Marital status: Married    Spouse name: Not on file  . Number of children: Not on file  . Years of education: Not on file  . Highest education level: Not on file  Occupational History  . Not on file  Tobacco Use  . Smoking status: Never Smoker  . Smokeless tobacco: Never Used  Vaping Use  . Vaping Use: Never used  Substance and Sexual Activity  . Alcohol use: Yes    Alcohol/week: 2.0 standard drinks    Types: 2  Glasses of wine per week  . Drug use: No  . Sexual activity: Yes  Other Topics Concern  . 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   Married   Takes care of grandchildren.    2 daughters in [place], son in [place].      Diet-regular, low salt.    Exercise watching grandchildren   Caffeine-4cups a day                Social Determinants of Health   Financial Resource Strain:   . Difficulty of Paying Living Expenses: Not on file  Food Insecurity:   . Worried About Charity fundraiser in the Last Year: Not on file  . Ran Out of Food in the Last Year: Not on file  Transportation Needs:   . Lack of Transportation (Medical): Not on file  . Lack of Transportation (Non-Medical): Not on file  Physical Activity:   . Days of Exercise per Week: Not on file  . Minutes of Exercise per Session: Not on file  Stress:   . Feeling of Stress : Not on file  Social Connections:   . Frequency of Communication with Friends and Family: Not on file  . Frequency of Social Gatherings with Friends and Family: Not on file  . Attends Religious Services: Not on file  . Active Member of  Clubs or Organizations: Not on file  . Attends Archivist Meetings: Not on file  . Marital Status: Not on file  Intimate Partner Violence:   . Fear of Current or Ex-Partner: Not on file  . Emotionally Abused: Not on file  . Physically Abused: Not on file  . Sexually Abused: Not on file    Family History  Problem Relation Age of Onset  . Hypertension Mother   . Cancer Mother 60       bladder  . Hepatitis C Mother   . Cancer Father 16       bladder  . Stroke Maternal Grandmother   . Hypertension Maternal Grandmother   . Colon cancer Paternal Aunt 47  . Breast cancer Neg Hx     Review of Systems  Constitutional: Negative.   HENT: Negative.   Eyes: Negative.   Respiratory: Negative.   Cardiovascular: Negative.   Gastrointestinal: Negative.   Genitourinary:  Negative.   Musculoskeletal: Negative.   Skin: Negative.   Neurological: Negative.   Psychiatric/Behavioral: Negative.      Physical Exam Vitals: BP 138/80   Ht 5\' 3"  (1.6 m)   Wt 177 lb (80.3 kg)   LMP 01/11/2015 (Approximate)   BMI 31.35 kg/m   Physical Exam Constitutional:      General: She is not in acute distress.    Appearance: Normal appearance. She is well-developed.  Genitourinary:     Pelvic exam was performed with patient in the lithotomy position.     Vulva, urethra, bladder and uterus normal.     No inguinal adenopathy present in the right or left side.    No signs of injury in the vagina.     No vaginal discharge, erythema, tenderness or bleeding.     No cervical motion tenderness, discharge, lesion or polyp.     Uterus is mobile.     Uterus is not enlarged or tender.     No uterine mass detected.    Uterus is anteverted.     No right or left adnexal mass present.     Right adnexa not tender or full.     Left adnexa not tender or full.  HENT:     Head: Normocephalic and atraumatic.  Eyes:     General: No scleral icterus.    Conjunctiva/sclera: Conjunctivae normal.  Neck:     Thyroid: No thyromegaly.  Cardiovascular:     Rate and Rhythm: Normal rate and regular rhythm.     Heart sounds: No murmur heard.  No friction rub. No gallop.   Pulmonary:     Effort: Pulmonary effort is normal. No respiratory distress.     Breath sounds: Normal breath sounds. No wheezing or rales.  Chest:     Breasts:        Right: No inverted nipple, mass, nipple discharge, skin change or tenderness.        Left: No inverted nipple, mass, nipple discharge, skin change or tenderness.  Abdominal:     General: Bowel sounds are normal. There is no distension.     Palpations: Abdomen is soft. There is no mass.     Tenderness: There is no abdominal tenderness. There is no guarding or rebound.  Musculoskeletal:        General: No swelling or tenderness. Normal range of motion.      Cervical back: Normal range of motion and neck supple.  Lymphadenopathy:     Cervical: No cervical adenopathy.  Lower Body: No right inguinal adenopathy. No left inguinal adenopathy.  Neurological:     General: No focal deficit present.     Mental Status: She is alert and oriented to person, place, and time.     Cranial Nerves: No cranial nerve deficit.  Skin:    General: Skin is warm and dry.     Findings: No erythema or rash.  Psychiatric:        Mood and Affect: Mood normal.        Behavior: Behavior normal.        Judgment: Judgment normal.     Female chaperone present for pelvic and breast  portions of the physical exam  Results: AUDIT Questionnaire (screen for alcoholism): 3 PHQ-9: 0   Assessment and Plan:  59 y.o. G98P3003 female here for routine annual gynecologic examination  Plan: Problem List Items Addressed This Visit    None    Visit Diagnoses    Women's annual routine gynecological examination    -  Primary   Screening for depression       Screening for alcoholism          Screening: -- Blood pressure screen managed by PCP -- Colonoscopy - not due -- Mammogram - not due -- Weight screening: normal -- Depression screening negative (PHQ-9) -- Nutrition: normal -- cholesterol screening: per PCP -- osteoporosis screening: not due -- tobacco screening: not using -- alcohol screening: AUDIT questionnaire indicates low-risk usage. -- family history of breast cancer screening: done. not at high risk. -- no evidence of domestic violence or intimate partner violence. -- STD screening: gonorrhea/chlamydia NAAT not collected per patient request. -- pap smear not collected per ASCCP guidelines -- flu vaccine received  Prentice Docker, MD 01/11/2020 4:03 PM

## 2020-05-27 ENCOUNTER — Other Ambulatory Visit: Payer: Self-pay | Admitting: Family

## 2020-05-27 DIAGNOSIS — I10 Essential (primary) hypertension: Secondary | ICD-10-CM

## 2020-06-20 ENCOUNTER — Ambulatory Visit: Payer: BC Managed Care – PPO | Admitting: Family

## 2020-09-03 ENCOUNTER — Other Ambulatory Visit: Payer: Self-pay | Admitting: Family

## 2020-09-03 DIAGNOSIS — I10 Essential (primary) hypertension: Secondary | ICD-10-CM

## 2020-09-10 ENCOUNTER — Other Ambulatory Visit: Payer: Self-pay | Admitting: Family

## 2020-09-10 DIAGNOSIS — I10 Essential (primary) hypertension: Secondary | ICD-10-CM

## 2020-10-27 ENCOUNTER — Other Ambulatory Visit: Payer: Self-pay

## 2020-10-27 ENCOUNTER — Encounter: Payer: Self-pay | Admitting: Family

## 2020-10-27 ENCOUNTER — Ambulatory Visit (INDEPENDENT_AMBULATORY_CARE_PROVIDER_SITE_OTHER): Payer: BC Managed Care – PPO | Admitting: Family

## 2020-10-27 VITALS — BP 129/89 | HR 97 | Temp 98.0°F | Ht 62.0 in | Wt 173.4 lb

## 2020-10-27 DIAGNOSIS — I10 Essential (primary) hypertension: Secondary | ICD-10-CM

## 2020-10-27 DIAGNOSIS — Z0000 Encounter for general adult medical examination without abnormal findings: Secondary | ICD-10-CM | POA: Diagnosis not present

## 2020-10-27 NOTE — Assessment & Plan Note (Signed)
Stable, controlled. Continue amlodipine, lisinopril. Advised low salt diet.

## 2020-10-27 NOTE — Assessment & Plan Note (Signed)
CBE performed. Deferred pap as she follows with GYN and reports normal pap smear. Encouraged exercise with light weights.

## 2020-10-27 NOTE — Patient Instructions (Signed)

## 2020-10-27 NOTE — Progress Notes (Signed)
Subjective:    Patient ID: [name], female    DOB: 1960-08-14, 60 y.o.   MRN: [id number]  CC: [name] is a 60 y.o. female who presents today for physical exam.    HPI: Feels well today No complaints HTN- at home 130/80.compliant with amlodipine 10mg , lisinopril 10mg . No cp    Colorectal Cancer Screening: UTD, on 5 year repeat Breast Cancer Screening: Mammogram UTD Cervical Cancer Screening: UTD, reported 01/10/2017 and reports normal per patient, Dr Micheline [facility] screening/DEXA for 65+: No increased fracture risk. Defer screening at this time.  Lung Cancer Screening: Doesn't have 20 year pack year history and age > 11 years yo 45 years          Tetanus - utd         Labs: Screening labs today. She will get lipid panel through work/Biometric screen Exercise: No regular exercise aside from working lifting boxes at [employer]   Alcohol use: none Smoking/tobacco use: Nonsmoker.     HISTORY:  Past Medical History:  Diagnosis Date  . Cancer ([place])    skin  . Hypertension    readings    Past Surgical History:  Procedure Laterality Date  . BREAST CYST ASPIRATION Right 2002  . COLONOSCOPY WITH PROPOFOL N/A 03/16/2017   Procedure: COLONOSCOPY WITH PROPOFOL;  Surgeon: Lin Landsman, MD;  Location: [facility];  Service: Endoscopy;  Laterality: N/A;  . CYST REMOVAL TRUNK    . [place]  2002  . POLYPECTOMY  03/16/2017   Procedure: POLYPECTOMY;  Surgeon: Lin Landsman, MD;  Location: [facility];  Service: Endoscopy;;   Family History  Problem Relation Age of Onset  . Hypertension Mother   . Cancer Mother 14       bladder  . Hepatitis C Mother   . Cancer Father 82       bladder  . Stroke Maternal Grandmother   . Hypertension Maternal Grandmother   . Colon cancer Paternal Aunt 53  . Breast cancer Neg Hx       ALLERGIES: Patient has no known allergies.  Current Outpatient Medications on File Prior to Visit  Medication Sig  Dispense Refill  . amLODipine (NORVASC) 10 MG tablet TAKE 1 TABLET BY MOUTH EVERY DAY 90 tablet 1  . lisinopril (ZESTRIL) 10 MG tablet TAKE 1 TABLET BY MOUTH EVERY DAY 90 tablet 0  . meclizine (ANTIVERT) 12.5 MG tablet Take 1 tablet (12.5 mg total) by mouth 3 (three) times daily as needed for dizziness. 30 tablet 0   No current facility-administered medications on file prior to visit.    Social History   Tobacco Use  . Smoking status: Never  . Smokeless tobacco: Never  Vaping Use  . Vaping Use: Never used  Substance Use Topics  . Alcohol use: Yes    Alcohol/week: 2.0 standard drinks    Types: 2 Glasses of wine per week  . Drug use: No    Review of Systems  Constitutional:  Negative for chills, fever and unexpected weight change.  HENT:  Negative for congestion.   Respiratory:  Negative for cough.   Cardiovascular:  Negative for chest pain, palpitations and leg swelling.  Gastrointestinal:  Negative for nausea and vomiting.  Musculoskeletal:  Negative for arthralgias and myalgias.  Skin:  Negative for rash.  Neurological:  Negative for headaches.  Hematological:  Negative for adenopathy.  Psychiatric/Behavioral:  Negative for confusion.      Objective:    BP 129/89  Pulse 97   Temp 98 F (36.7 C) (Oral)   Ht 5\' 2"  (1.575 m)   Wt 173 lb 6.4 oz (78.7 kg)   LMP 01/11/2015 (Approximate)   SpO2 98%   BMI 31.72 kg/m   BP Readings from Last 3 Encounters:  10/27/20 129/89  01/11/20 138/80  12/21/19 122/76   Wt Readings from Last 3 Encounters:  10/27/20 173 lb 6.4 oz (78.7 kg)  01/11/20 177 lb (80.3 kg)  12/21/19 178 lb (80.7 kg)    Physical Exam Vitals reviewed.  Constitutional:      Appearance: Normal appearance. She is well-developed.  Eyes:     Conjunctiva/sclera: Conjunctivae normal.  Neck:     Thyroid: No thyroid mass or thyromegaly.  Cardiovascular:     Rate and Rhythm: Normal rate and regular rhythm.     Pulses: Normal pulses.     Heart sounds:  Normal heart sounds.  Pulmonary:     Effort: Pulmonary effort is normal.     Breath sounds: Normal breath sounds. No wheezing, rhonchi or rales.  Chest:  Breasts:    Breasts are symmetrical.     Right: No inverted nipple, mass, nipple discharge, skin change or tenderness.     Left: No inverted nipple, mass, nipple discharge, skin change or tenderness.  Abdominal:     General: Bowel sounds are normal. There is no distension.     Palpations: Abdomen is soft. Abdomen is not rigid. There is no fluid wave or mass.     Tenderness: There is no abdominal tenderness. There is no guarding or rebound.  Lymphadenopathy:     Head:     Right side of head: No submental, submandibular, tonsillar, preauricular, posterior auricular or occipital adenopathy.     Left side of head: No submental, submandibular, tonsillar, preauricular, posterior auricular or occipital adenopathy.     Cervical: No cervical adenopathy.     Right cervical: No superficial, deep or posterior cervical adenopathy.    Left cervical: No superficial, deep or posterior cervical adenopathy.  Skin:    General: Skin is warm and dry.  Neurological:     Mental Status: She is alert.  Psychiatric:        Speech: Speech normal.        Behavior: Behavior normal.        Thought Content: Thought content normal.       Assessment & Plan:   Problem List Items Addressed This Visit       Cardiovascular and Mediastinum   Essential hypertension, benign    Stable, controlled. Continue amlodipine, lisinopril. Advised low salt diet.          Other   Routine physical examination - Primary    CBE performed. Deferred pap as she follows with GYN and reports normal pap smear. Encouraged exercise with light weights.        Relevant Orders   TSH   CBC with Differential/Platelet   Comprehensive metabolic panel   VITAMIN D 25 Hydroxy (Vit-D Deficiency, Fractures)   Hemoglobin A1c     I am having [name] maintain her meclizine,  amLODipine, and lisinopril.   No orders of the defined types were placed in this encounter.   Return precautions given.   Risks, benefits, and alternatives of the medications and treatment plan prescribed today were discussed, and patient expressed understanding.   Education regarding symptom management and diagnosis given to patient on AVS.   Continue to follow with Burnard Hawthorne, FNP for routine  health maintenance.   [name] and I agreed with plan.   Mable Paris, FNP

## 2020-10-28 LAB — CBC WITH DIFFERENTIAL/PLATELET
Basophils Absolute: 0 10*3/uL (ref 0.0–0.1)
Basophils Relative: 0.4 % (ref 0.0–3.0)
Eosinophils Absolute: 0.1 10*3/uL (ref 0.0–0.7)
Eosinophils Relative: 1.1 % (ref 0.0–5.0)
HCT: 39.3 % (ref 36.0–46.0)
Hemoglobin: 13.4 g/dL (ref 12.0–15.0)
Lymphocytes Relative: 27.4 % (ref 12.0–46.0)
Lymphs Abs: 2 10*3/uL (ref 0.7–4.0)
MCHC: 34 g/dL (ref 30.0–36.0)
MCV: 90.6 fl (ref 78.0–100.0)
Monocytes Absolute: 0.3 10*3/uL (ref 0.1–1.0)
Monocytes Relative: 4.3 % (ref 3.0–12.0)
Neutro Abs: 4.8 10*3/uL (ref 1.4–7.7)
Neutrophils Relative %: 66.8 % (ref 43.0–77.0)
Platelets: 277 10*3/uL (ref 150.0–400.0)
RBC: 4.34 Mil/uL (ref 3.87–5.11)
RDW: 13.7 % (ref 11.5–15.5)
WBC: 7.2 10*3/uL (ref 4.0–10.5)

## 2020-10-28 LAB — COMPREHENSIVE METABOLIC PANEL
ALT: 10 U/L (ref 0–35)
AST: 12 U/L (ref 0–37)
Albumin: 4.9 g/dL (ref 3.5–5.2)
Alkaline Phosphatase: 69 U/L (ref 39–117)
BUN: 13 mg/dL (ref 6–23)
CO2: 26 mEq/L (ref 19–32)
Calcium: 9.7 mg/dL (ref 8.4–10.5)
Chloride: 100 mEq/L (ref 96–112)
Creatinine, Ser: 0.79 mg/dL (ref 0.40–1.20)
GFR: 81.51 mL/min (ref >60.00)
Glucose, Bld: 96 mg/dL (ref 70–99)
Potassium: 3.8 mEq/L (ref 3.5–5.1)
Sodium: 137 mEq/L (ref 135–145)
Total Bilirubin: 0.5 mg/dL (ref 0.2–1.2)
Total Protein: 7.9 g/dL (ref 6.0–8.3)

## 2020-10-28 LAB — VITAMIN D 25 HYDROXY (VIT D DEFICIENCY, FRACTURES): VITD: 26.43 ng/mL — ABNORMAL LOW (ref 30.00–100.00)

## 2020-10-28 LAB — TSH: TSH: 1.28 u[IU]/mL (ref 0.35–5.50)

## 2020-10-28 LAB — HEMOGLOBIN A1C: Hgb A1c MFr Bld: 5.8 % (ref 4.6–6.5)

## 2020-12-07 ENCOUNTER — Other Ambulatory Visit: Payer: Self-pay | Admitting: Family

## 2020-12-07 DIAGNOSIS — I10 Essential (primary) hypertension: Secondary | ICD-10-CM

## 2021-01-29 ENCOUNTER — Other Ambulatory Visit: Payer: Self-pay | Admitting: Family

## 2021-01-29 DIAGNOSIS — Z1231 Encounter for screening mammogram for malignant neoplasm of breast: Secondary | ICD-10-CM

## 2021-02-18 ENCOUNTER — Other Ambulatory Visit: Payer: Self-pay

## 2021-02-18 ENCOUNTER — Ambulatory Visit
Admission: RE | Admit: 2021-02-18 | Discharge: 2021-02-18 | Disposition: A | Discharge status: Home / Self Care | Payer: BC Managed Care – PPO | Source: Ambulatory Visit | Attending: Family | Admitting: Family

## 2021-02-18 DIAGNOSIS — Z1231 Encounter for screening mammogram for malignant neoplasm of breast: Secondary | ICD-10-CM | POA: Diagnosis not present

## 2021-03-07 ENCOUNTER — Other Ambulatory Visit: Payer: Self-pay | Admitting: Family

## 2021-03-07 DIAGNOSIS — I10 Essential (primary) hypertension: Secondary | ICD-10-CM

## 2021-03-13 DIAGNOSIS — Z23 Encounter for immunization: Secondary | ICD-10-CM | POA: Diagnosis not present

## 2021-04-08 DIAGNOSIS — Z20822 Contact with and (suspected) exposure to covid-19: Secondary | ICD-10-CM | POA: Diagnosis not present

## 2021-04-23 ENCOUNTER — Other Ambulatory Visit: Payer: Self-pay | Admitting: Family

## 2021-04-23 DIAGNOSIS — I10 Essential (primary) hypertension: Secondary | ICD-10-CM

## 2021-04-30 ENCOUNTER — Encounter: Payer: Self-pay | Admitting: Family

## 2021-06-02 ENCOUNTER — Other Ambulatory Visit: Payer: Self-pay | Admitting: Family

## 2021-06-02 DIAGNOSIS — I10 Essential (primary) hypertension: Secondary | ICD-10-CM

## 2021-09-01 ENCOUNTER — Other Ambulatory Visit: Payer: Self-pay | Admitting: Family

## 2021-09-01 DIAGNOSIS — I10 Essential (primary) hypertension: Secondary | ICD-10-CM

## 2021-09-25 ENCOUNTER — Other Ambulatory Visit: Payer: Self-pay | Admitting: Family

## 2021-09-25 DIAGNOSIS — I10 Essential (primary) hypertension: Secondary | ICD-10-CM

## 2021-10-21 ENCOUNTER — Other Ambulatory Visit: Payer: Self-pay | Admitting: Family

## 2021-10-21 DIAGNOSIS — I10 Essential (primary) hypertension: Secondary | ICD-10-CM

## 2021-11-13 ENCOUNTER — Other Ambulatory Visit: Payer: Self-pay | Admitting: Family

## 2021-11-13 DIAGNOSIS — I10 Essential (primary) hypertension: Secondary | ICD-10-CM

## 2021-11-17 ENCOUNTER — Ambulatory Visit (INDEPENDENT_AMBULATORY_CARE_PROVIDER_SITE_OTHER): Payer: BC Managed Care – PPO | Admitting: Family

## 2021-11-17 ENCOUNTER — Encounter: Payer: Self-pay | Admitting: Family

## 2021-11-17 VITALS — BP 138/86 | HR 86 | Temp 99.0°F | Ht 62.0 in | Wt 175.8 lb

## 2021-11-17 DIAGNOSIS — Z0000 Encounter for general adult medical examination without abnormal findings: Secondary | ICD-10-CM

## 2021-11-17 DIAGNOSIS — I10 Essential (primary) hypertension: Secondary | ICD-10-CM | POA: Diagnosis not present

## 2021-11-17 DIAGNOSIS — Z8052 Family history of malignant neoplasm of bladder: Secondary | ICD-10-CM | POA: Diagnosis not present

## 2021-11-17 LAB — COMPREHENSIVE METABOLIC PANEL
ALT: 10 U/L (ref 0–35)
AST: 12 U/L (ref 0–37)
Albumin: 4.7 g/dL (ref 3.5–5.2)
Alkaline Phosphatase: 77 U/L (ref 39–117)
BUN: 16 mg/dL (ref 6–23)
CO2: 26 mEq/L (ref 19–32)
Calcium: 9.6 mg/dL (ref 8.4–10.5)
Chloride: 100 mEq/L (ref 96–112)
Creatinine, Ser: 0.77 mg/dL (ref 0.40–1.20)
GFR: 83.43 mL/min (ref >60.00)
Glucose, Bld: 104 mg/dL — ABNORMAL HIGH (ref 70–99)
Potassium: 3.7 mEq/L (ref 3.5–5.1)
Sodium: 138 mEq/L (ref 135–145)
Total Bilirubin: 0.7 mg/dL (ref 0.2–1.2)
Total Protein: 7.8 g/dL (ref 6.0–8.3)

## 2021-11-17 LAB — URINALYSIS, ROUTINE W REFLEX MICROSCOPIC
Bilirubin Urine: NEGATIVE
Ketones, ur: NEGATIVE
Nitrite: NEGATIVE
Specific Gravity, Urine: <=1.005 — AB (ref 1.000–1.030)
Total Protein, Urine: NEGATIVE
Urine Glucose: NEGATIVE
Urobilinogen, UA: 0.2 (ref 0.0–1.0)
pH: 6.5 (ref 5.0–8.0)

## 2021-11-17 LAB — TSH: TSH: 2.08 u[IU]/mL (ref 0.35–5.50)

## 2021-11-17 LAB — CBC WITH DIFFERENTIAL/PLATELET
Basophils Absolute: 0 10*3/uL (ref 0.0–0.1)
Basophils Relative: 0.4 % (ref 0.0–3.0)
Eosinophils Absolute: 0.2 10*3/uL (ref 0.0–0.7)
Eosinophils Relative: 2.8 % (ref 0.0–5.0)
HCT: 39.1 % (ref 36.0–46.0)
Hemoglobin: 13 g/dL (ref 12.0–15.0)
Lymphocytes Relative: 32.5 % (ref 12.0–46.0)
Lymphs Abs: 1.9 10*3/uL (ref 0.7–4.0)
MCHC: 33.3 g/dL (ref 30.0–36.0)
MCV: 90.6 fl (ref 78.0–100.0)
Monocytes Absolute: 0.4 10*3/uL (ref 0.1–1.0)
Monocytes Relative: 7.1 % (ref 3.0–12.0)
Neutro Abs: 3.3 10*3/uL (ref 1.4–7.7)
Neutrophils Relative %: 57.2 % (ref 43.0–77.0)
Platelets: 261 10*3/uL (ref 150.0–400.0)
RBC: 4.32 Mil/uL (ref 3.87–5.11)
RDW: 13.5 % (ref 11.5–15.5)
WBC: 5.7 10*3/uL (ref 4.0–10.5)

## 2021-11-17 LAB — VITAMIN D 25 HYDROXY (VIT D DEFICIENCY, FRACTURES): VITD: 15.83 ng/mL — ABNORMAL LOW (ref 30.00–100.00)

## 2021-11-17 LAB — HEMOGLOBIN A1C: Hgb A1c MFr Bld: 6 % (ref 4.6–6.5)

## 2021-11-17 NOTE — Assessment & Plan Note (Signed)
Clinical breast exam performed today.  Referral for colonoscopy and mammogram.  Emphasized the importance of continued follow-up with dermatology for annual surveillance of skin cancer.  Patient will call to schedule this appointment.  Patient prefers to continue to follow with GYN and will also call to schedule with new provider as prior provider has left practice.  She will let me know if she needs a referral for either.  Encouraged walking program.

## 2021-11-17 NOTE — Assessment & Plan Note (Signed)
Elevated during today's visit.  Discussed with patient blood pressure goal pressure 120/80.  She politely declines increasing regimen at this time and she would like to continue to monitor blood pressure at home as well as focus on weight loss which in the past has helped to lower her blood pressure.  Continue amlodipine 10 mg, lisinopril 10 mg.

## 2021-11-17 NOTE — Progress Notes (Signed)
Subjective:    Patient ID: Davona Kinoshita, female    DOB: 06-05-1960, 61 y.o.   MRN: [id number]  CC: Kallee Nam is a 61 y.o. female who presents today for physical exam.    HPI: Feels well today.  No new complaints.   HTN- BP at home 130/80. No cp, sob.  She is compliant with amlodipine 10 mg, lisinopril 10 mg.   H/o BCC, SCC. No recent follow up Dermatology  Colorectal Cancer Screening: UTD , due 03/2022 Breast Cancer Screening: Mammogram UTD Cervical Cancer Screening: UTD, Dr Glennon Mac, abstracted as negative 01/11/2020 however no report in [place] screening/DEXA for 65+: No increased fracture risk. Defer screening at this time. Labs: Screening labs today.Lipid panel done through insurance Exercise: No formal exercise.   Alcohol use:  none Smoking/tobacco use: Nonsmoker.     HISTORY:  Past Medical History:  Diagnosis Date   Cancer ([place])    skin   Hypertension    readings    Past Surgical History:  Procedure Laterality Date   BREAST CYST ASPIRATION Right 2002   COLONOSCOPY WITH PROPOFOL N/A 03/16/2017   Procedure: COLONOSCOPY WITH PROPOFOL;  Surgeon: Lin Landsman, MD;  Location: [facility];  Service: Endoscopy;  Laterality: N/A;   CYST REMOVAL TRUNK     MOHS SURGERY  2002   POLYPECTOMY  03/16/2017   Procedure: POLYPECTOMY;  Surgeon: Lin Landsman, MD;  Location: [facility];  Service: Endoscopy;;   Family History  Problem Relation Age of Onset   Hypertension Mother    Cancer Mother 72       bladder   Hepatitis C Mother    Cancer Father 32       bladder   Stroke Maternal Grandmother    Hypertension Maternal Grandmother    Colon cancer Paternal Aunt 72   Breast cancer Neg Hx       ALLERGIES: Patient has no known allergies.  Current Outpatient Medications on File Prior to Visit  Medication Sig Dispense Refill   amLODipine (NORVASC) 10 MG tablet TAKE 1 TABLET BY MOUTH EVERY DAY 30 tablet 1   lisinopril (ZESTRIL) 10  MG tablet TAKE 1 TABLET BY MOUTH EVERY DAY 30 tablet 1   meclizine (ANTIVERT) 12.5 MG tablet Take 1 tablet (12.5 mg total) by mouth 3 (three) times daily as needed for dizziness. 30 tablet 0   No current facility-administered medications on file prior to visit.    Social History   Tobacco Use   Smoking status: Never   Smokeless tobacco: Never  Vaping Use   Vaping Use: Never used  Substance Use Topics   Alcohol use: Yes    Alcohol/week: 2.0 standard drinks of alcohol    Types: 2 Glasses of wine per week   Drug use: No    Review of Systems  Constitutional:  Negative for chills, fever and unexpected weight change.  HENT:  Negative for congestion.   Respiratory:  Negative for cough.   Cardiovascular:  Negative for chest pain, palpitations and leg swelling.  Gastrointestinal:  Negative for nausea and vomiting.  Musculoskeletal:  Negative for arthralgias and myalgias.  Skin:  Negative for rash.  Neurological:  Negative for headaches.  Hematological:  Negative for adenopathy.  Psychiatric/Behavioral:  Negative for confusion.       Objective:    BP 138/86 (BP Location: Left Arm, Patient Position: Sitting, Cuff Size: Normal)   Pulse 86   Temp 99 F (37.2 C) (Oral)   Ht 5'  2" (1.575 m)   Wt 175 lb 12.8 oz (79.7 kg)   LMP 01/11/2015 (Approximate)   SpO2 99%   BMI 32.15 kg/m   BP Readings from Last 3 Encounters:  11/17/21 138/86  10/27/20 129/89  01/11/20 138/80   Wt Readings from Last 3 Encounters:  11/17/21 175 lb 12.8 oz (79.7 kg)  10/27/20 173 lb 6.4 oz (78.7 kg)  01/11/20 177 lb (80.3 kg)    Physical Exam Vitals reviewed.  Constitutional:      Appearance: Normal appearance. She is well-developed.  Eyes:     Conjunctiva/sclera: Conjunctivae normal.  Neck:     Thyroid: No thyroid mass or thyromegaly.  Cardiovascular:     Rate and Rhythm: Normal rate and regular rhythm.     Pulses: Normal pulses.     Heart sounds: Normal heart sounds.  Pulmonary:      Effort: Pulmonary effort is normal.     Breath sounds: Normal breath sounds. No wheezing, rhonchi or rales.  Chest:  Breasts:    Breasts are symmetrical.     Right: No inverted nipple, mass, nipple discharge, skin change or tenderness.     Left: No inverted nipple, mass, nipple discharge, skin change or tenderness.  Abdominal:     General: Bowel sounds are normal. There is no distension.     Palpations: Abdomen is soft. Abdomen is not rigid. There is no fluid wave or mass.     Tenderness: There is no abdominal tenderness. There is no guarding or rebound.  Lymphadenopathy:     Head:     Right side of head: No submental, submandibular, tonsillar, preauricular, posterior auricular or occipital adenopathy.     Left side of head: No submental, submandibular, tonsillar, preauricular, posterior auricular or occipital adenopathy.     Cervical: No cervical adenopathy.     Right cervical: No superficial, deep or posterior cervical adenopathy.    Left cervical: No superficial, deep or posterior cervical adenopathy.  Skin:    General: Skin is warm and dry.  Neurological:     Mental Status: She is alert.  Psychiatric:        Speech: Speech normal.        Behavior: Behavior normal.        Thought Content: Thought content normal.        Assessment & Plan:   Problem List Items Addressed This Visit       Cardiovascular and Mediastinum   Essential hypertension, benign    Elevated during today's visit.  Discussed with patient blood pressure goal pressure 120/80.  She politely declines increasing regimen at this time and she would like to continue to monitor blood pressure at home as well as focus on weight loss which in the past has helped to lower her blood pressure.  Continue amlodipine 10 mg, lisinopril 10 mg.        Other   Family history of bladder cancer   Relevant Orders   Urinalysis, Routine w reflex microscopic   Routine physical examination - Primary    Clinical breast exam  performed today.  Referral for colonoscopy and mammogram.  Emphasized the importance of continued follow-up with dermatology for annual surveillance of skin cancer.  Patient will call to schedule this appointment.  Patient prefers to continue to follow with GYN and will also call to schedule with new provider as prior provider has left practice.  She will let me know if she needs a referral for either.  Encouraged walking program.  Relevant Orders   Ambulatory referral to Gastroenterology   MM 3D SCREEN BREAST BILATERAL   CBC with Differential/Platelet   Comprehensive metabolic panel   Hemoglobin A1c   TSH   VITAMIN D 25 Hydroxy (Vit-D Deficiency, Fractures)     I am having Jodi Geralds maintain her meclizine, lisinopril, and amLODipine.   No orders of the defined types were placed in this encounter.   Return precautions given.   Risks, benefits, and alternatives of the medications and treatment plan prescribed today were discussed, and patient expressed understanding.   Education regarding symptom management and diagnosis given to patient on AVS.   Continue to follow with Burnard Hawthorne, FNP for routine health maintenance.   Jodi Geralds and I agreed with plan.   Mable Paris, FNP

## 2021-11-17 NOTE — Patient Instructions (Addendum)
Please call  and schedule your 3D mammogram and /or bone density scan as we discussed.   [name]  ( new location in 2023)  [address], [place], [place] [address]  [name], [name]   Let me know who you decide regarding GYN and dermatology and if you need a referral.  Health Maintenance for Postmenopausal Women Menopause is a normal process in which your ability to get pregnant comes to an end. This process happens slowly over many months or years, usually between the ages of 46 and 18. Menopause is complete when you have missed your menstrual period for 12 months. It is important to talk with your health care provider about some of the most common conditions that affect women after menopause (postmenopausal women). These include heart disease, cancer, and bone loss (osteoporosis). Adopting a healthy lifestyle and getting preventive care can help to promote your health and wellness. The actions you take can also lower your chances of developing some of these common conditions. What are the signs and symptoms of menopause? During menopause, you may have the following symptoms: Hot flashes. These can be moderate or severe. Night sweats. Decrease in sex drive. Mood swings. Headaches. Tiredness (fatigue). Irritability. Memory problems. Problems falling asleep or staying asleep. Talk with your health care provider about treatment options for your symptoms. Do I need hormone replacement therapy? Hormone replacement therapy is effective in treating symptoms that are caused by menopause, such as hot flashes and night sweats. Hormone replacement carries certain risks, especially as you become older. If you are thinking about using estrogen or estrogen with progestin, discuss the benefits and risks with your health care provider. How can I reduce my risk for heart disease and stroke? The risk of heart disease, heart attack, and stroke increases as you  age. One of the causes may be a change in the body's hormones during menopause. This can affect how your body uses dietary fats, triglycerides, and cholesterol. Heart attack and stroke are medical emergencies. There are many things that you can do to help prevent heart disease and stroke. Watch your blood pressure High blood pressure causes heart disease and increases the risk of stroke. This is more likely to develop in people who have high blood pressure readings or are overweight. Have your blood pressure checked: Every 3-5 years if you are 59-38 years of age. Every year if you are 38 years old or older. Eat a healthy diet  Eat a diet that includes plenty of vegetables, fruits, low-fat dairy products, and lean protein. Do not eat a lot of foods that are high in solid fats, added sugars, or sodium. Get regular exercise Get regular exercise. This is one of the most important things you can do for your health. Most adults should: Try to exercise for at least 150 minutes each week. The exercise should increase your heart rate and make you sweat (moderate-intensity exercise). Try to do strengthening exercises at least twice each week. Do these in addition to the moderate-intensity exercise. Spend less time sitting. Even light physical activity can be beneficial. Other tips Work with your health care provider to achieve or maintain a healthy weight. Do not use any products that contain nicotine or tobacco. These products include cigarettes, chewing tobacco, and vaping devices, such as e-cigarettes. If you need help quitting, ask your health care provider. Know your numbers. Ask your health care provider to check your cholesterol and your blood sugar (glucose). Continue to have  your blood tested as directed by your health care provider. Do I need screening for cancer? Depending on your health history and family history, you may need to have cancer screenings at different stages of your life. This may  include screening for: Breast cancer. Cervical cancer. Lung cancer. Colorectal cancer. What is my risk for osteoporosis? After menopause, you may be at increased risk for osteoporosis. Osteoporosis is a condition in which bone destruction happens more quickly than new bone creation. To help prevent osteoporosis or the bone fractures that can happen because of osteoporosis, you may take the following actions: If you are 54-53 years old, get at least 1,000 mg of calcium and at least 600 international units (IU) of vitamin D per day. If you are older than age 77 but younger than age 32, get at least 1,200 mg of calcium and at least 600 international units (IU) of vitamin D per day. If you are older than age 18, get at least 1,200 mg of calcium and at least 800 international units (IU) of vitamin D per day. Smoking and drinking excessive alcohol increase the risk of osteoporosis. Eat foods that are rich in calcium and vitamin D, and do weight-bearing exercises several times each week as directed by your health care provider. How does menopause affect my mental health? Depression may occur at any age, but it is more common as you become older. Common symptoms of depression include: Feeling depressed. Changes in sleep patterns. Changes in appetite or eating patterns. Feeling an overall lack of motivation or enjoyment of activities that you previously enjoyed. Frequent crying spells. Talk with your health care provider if you think that you are experiencing any of these symptoms. General instructions See your health care provider for regular wellness exams and vaccines. This may include: Scheduling regular health, dental, and eye exams. Getting and maintaining your vaccines. These include: Influenza vaccine. Get this vaccine each year before the flu season begins. Pneumonia vaccine. Shingles vaccine. Tetanus, diphtheria, and pertussis (Tdap) booster vaccine. Your health care provider may also  recommend other immunizations. Tell your health care provider if you have ever been abused or do not feel safe at home. Summary Menopause is a normal process in which your ability to get pregnant comes to an end. This condition causes hot flashes, night sweats, decreased interest in sex, mood swings, headaches, or lack of sleep. Treatment for this condition may include hormone replacement therapy. Take actions to keep yourself healthy, including exercising regularly, eating a healthy diet, watching your weight, and checking your blood pressure and blood sugar levels. Get screened for cancer and depression. Make sure that you are up to date with all your vaccines. This information is not intended to replace advice given to you by your health care provider. Make sure you discuss any questions you have with your health care provider. Document Revised: 08/18/2020 Document Reviewed: 08/18/2020 Elsevier Patient Education  [employer].

## 2021-11-18 ENCOUNTER — Other Ambulatory Visit: Payer: BC Managed Care – PPO

## 2021-11-18 ENCOUNTER — Telehealth: Payer: Self-pay | Admitting: Family

## 2021-11-18 DIAGNOSIS — R8290 Unspecified abnormal findings in urine: Secondary | ICD-10-CM

## 2021-11-18 NOTE — Telephone Encounter (Signed)
Add on sheet faxed  There was no future order in, so I had to order it too.   Also please keep it mind it is now over 24 hours old. So our lab may reject this request to send culture to [place].

## 2021-11-18 NOTE — Telephone Encounter (Signed)
Jenate,  Please order urine culture  Toya, jill, can we add on from yesterday?

## 2021-11-19 LAB — URINE CULTURE
MICRO NUMBER:: 13756422
Result:: NO GROWTH
SPECIMEN QUALITY:: ADEQUATE

## 2021-11-22 ENCOUNTER — Other Ambulatory Visit: Payer: Self-pay | Admitting: Family

## 2021-11-22 DIAGNOSIS — I10 Essential (primary) hypertension: Secondary | ICD-10-CM

## 2021-12-09 ENCOUNTER — Telehealth: Payer: Self-pay

## 2021-12-09 NOTE — Telephone Encounter (Signed)
LVM to call back to go over results 

## 2022-02-03 IMAGING — MG MM DIGITAL SCREENING BILAT W/ TOMO AND CAD
8 series · 8 of 24 positions shown · non-contrast
Comparison: Previous exam(s).

CLINICAL DATA: Screening.

EXAM:
DIGITAL SCREENING BILATERAL MAMMOGRAM WITH TOMOSYNTHESIS AND CAD
TECHNIQUE: Bilateral screening digital craniocaudal and mediolateral oblique
mammograms were obtained. Bilateral screening digital breast
tomosynthesis was performed. The images were evaluated with
computer-aided detection.

[R MLO synth-2D]
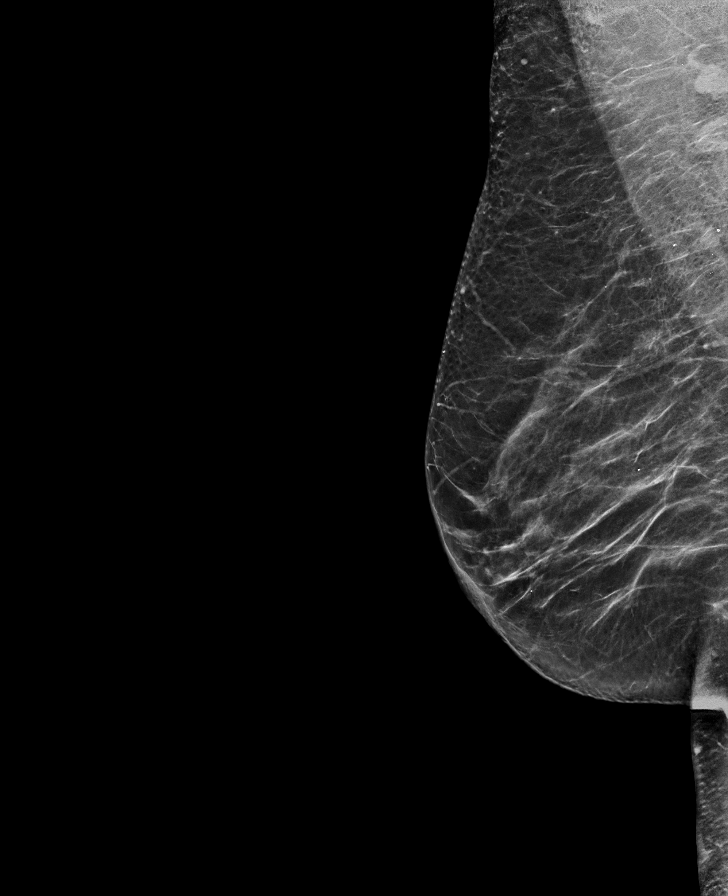

[L MLO synth-2D]
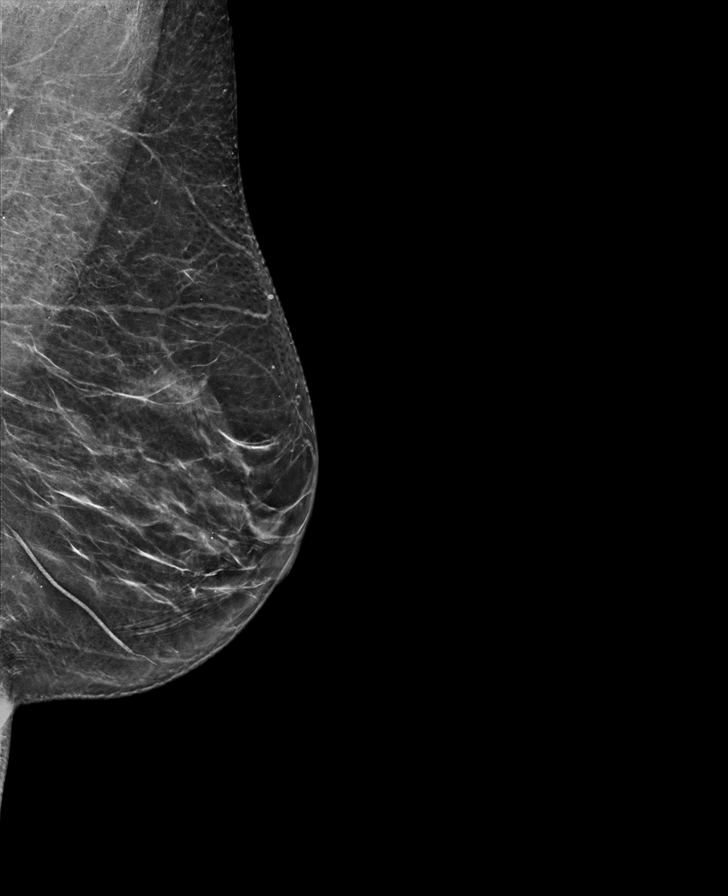

[L CC synth-2D]
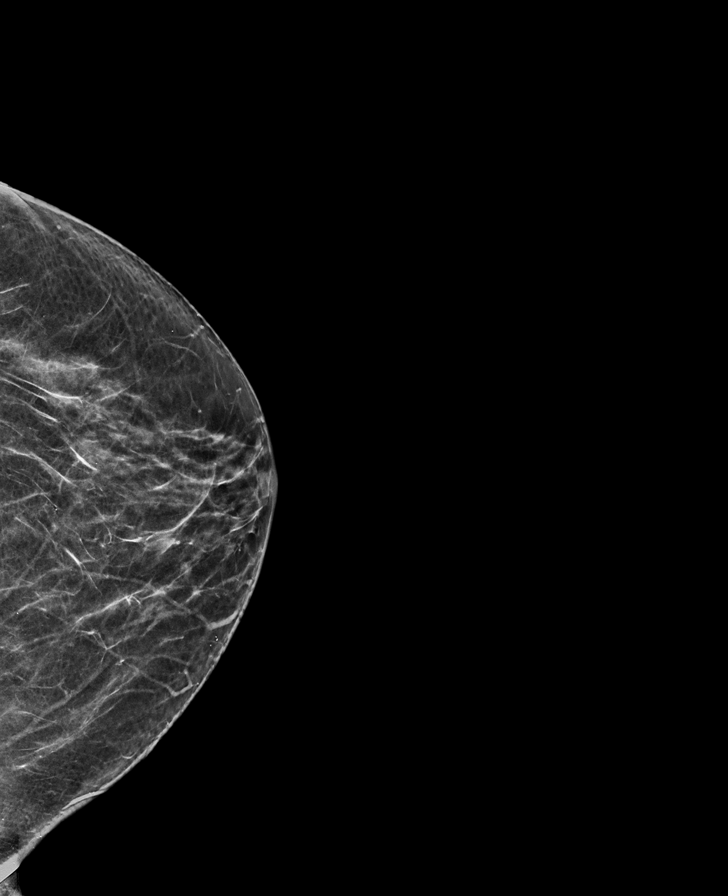

[R CC synth-2D]
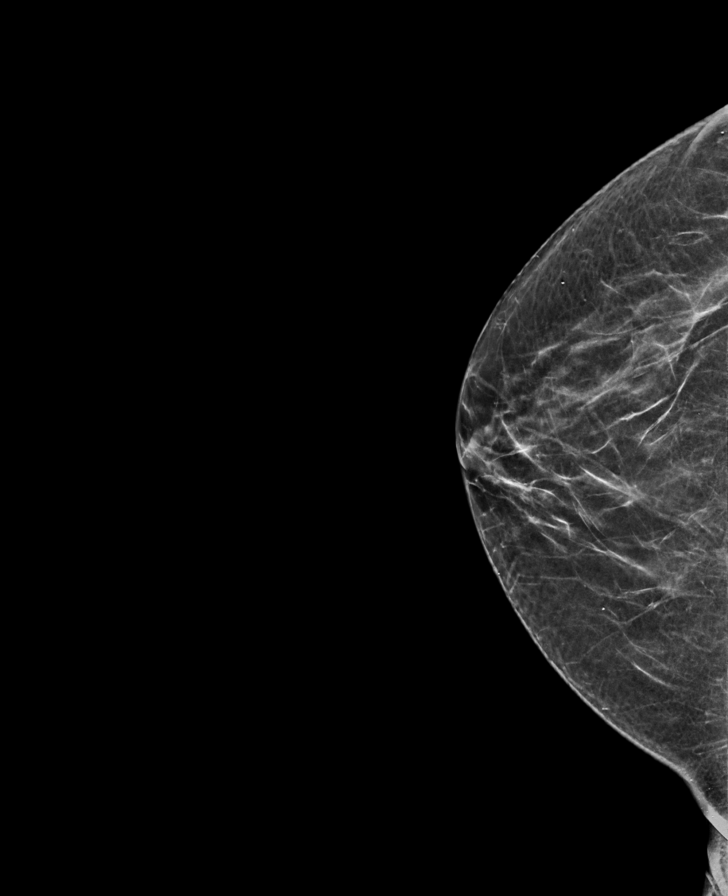

[R CC tomo · tomo slice 29/56.0]
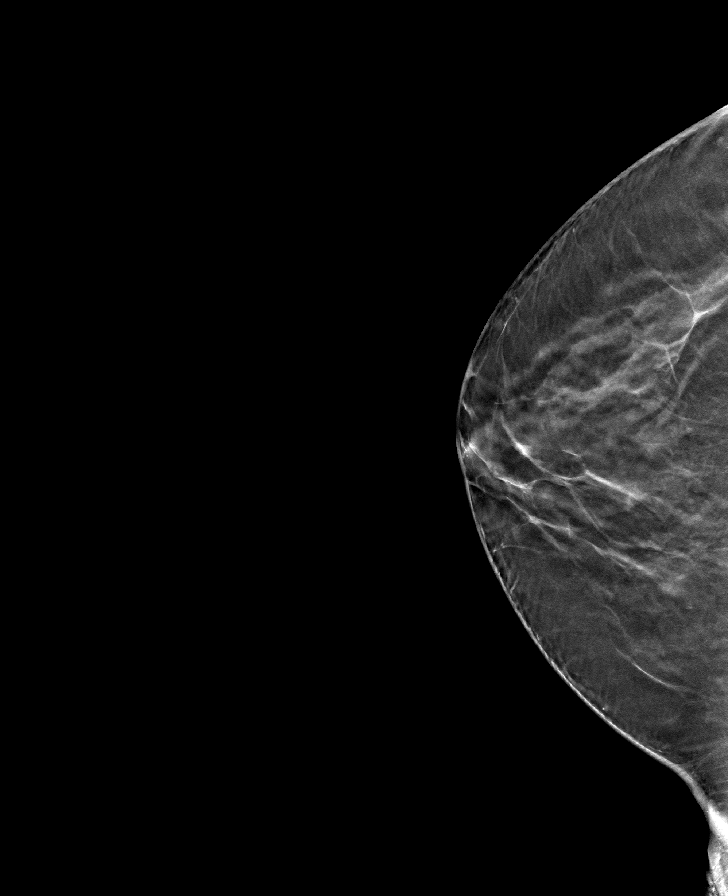

[L MLO tomo · tomo slice 37/72.0]
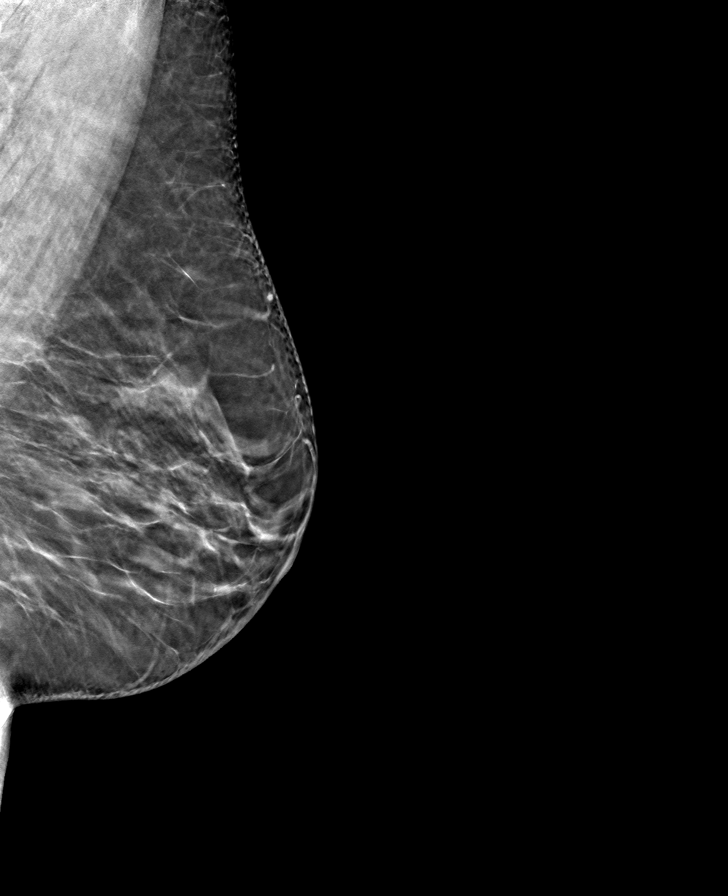

[R MLO tomo · tomo slice 37/72.0]
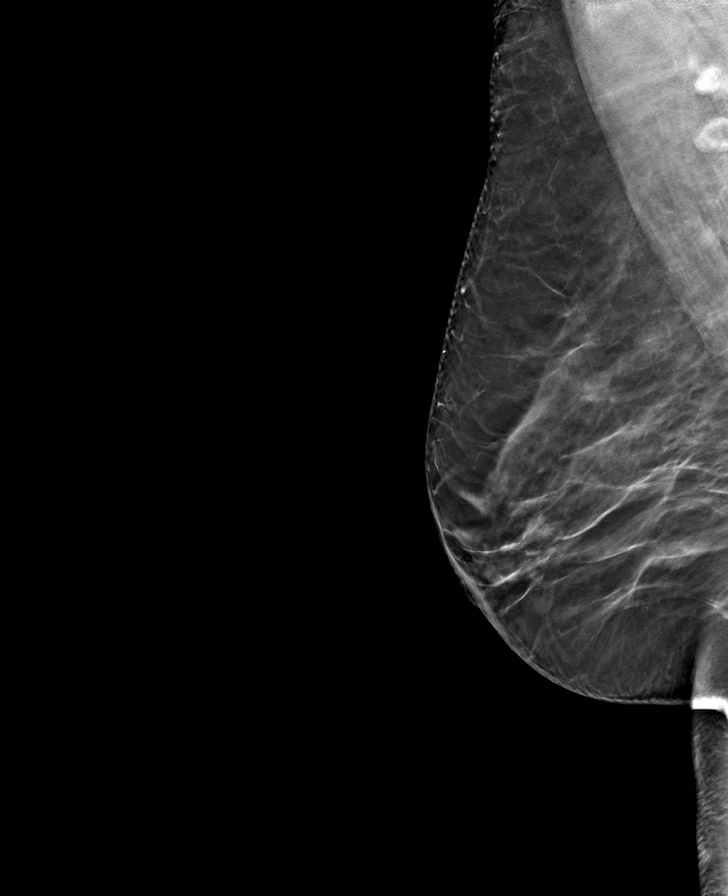

[L CC tomo · tomo slice 30/59.0]
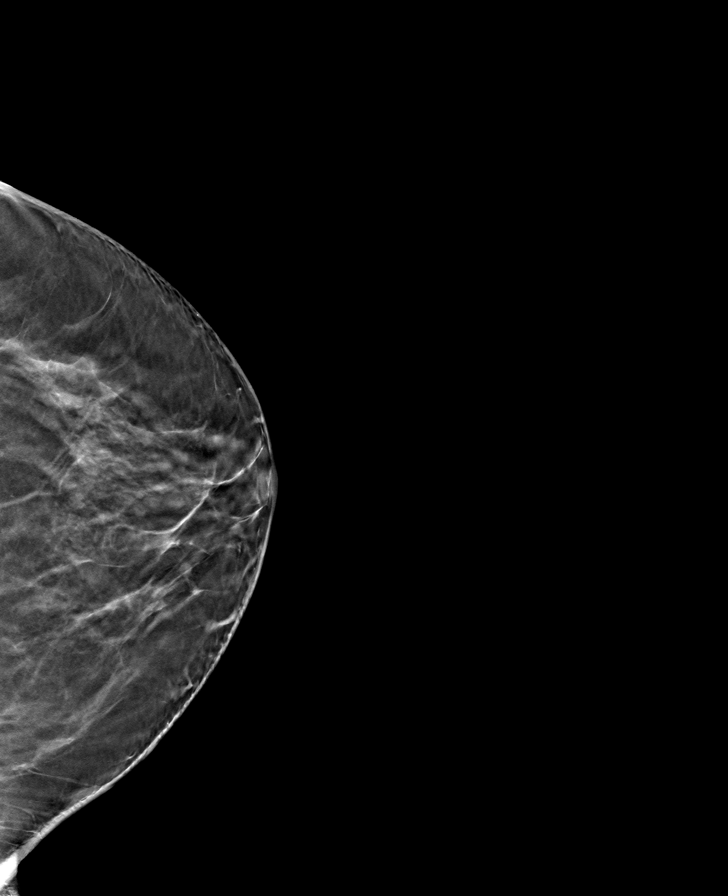

[8 of 24 positions shown; findings below may reference images not displayed]

ACR Breast Density Category b: There are scattered areas of
fibroglandular density.
FINDINGS: There are no findings suspicious for malignancy.
IMPRESSION: No mammographic evidence of malignancy. A result letter of this
screening mammogram will be mailed directly to the patient.

RECOMMENDATION:
Screening mammogram in one year. (Code:51-O-LD2)

BI-RADS CATEGORY  1: Negative.

## 2022-02-25 ENCOUNTER — Ambulatory Visit: Payer: BC Managed Care – PPO

## 2022-03-11 ENCOUNTER — Ambulatory Visit
Admission: RE | Admit: 2022-03-11 | Discharge: 2022-03-11 | Disposition: A | Discharge status: Home / Self Care | Payer: BC Managed Care – PPO | Source: Ambulatory Visit | Attending: Family | Admitting: Family

## 2022-03-11 DIAGNOSIS — Z1231 Encounter for screening mammogram for malignant neoplasm of breast: Secondary | ICD-10-CM | POA: Diagnosis not present

## 2022-03-11 DIAGNOSIS — Z0000 Encounter for general adult medical examination without abnormal findings: Secondary | ICD-10-CM

## 2022-03-15 ENCOUNTER — Telehealth: Payer: Self-pay | Admitting: Family

## 2022-03-15 ENCOUNTER — Encounter: Payer: Self-pay | Admitting: Family

## 2022-03-15 ENCOUNTER — Other Ambulatory Visit: Payer: Self-pay | Admitting: Family

## 2022-03-15 DIAGNOSIS — Z20828 Contact with and (suspected) exposure to other viral communicable diseases: Secondary | ICD-10-CM

## 2022-03-15 MED ORDER — OSELTAMIVIR PHOSPHATE 75 MG PO CAPS: 75.0000 mg | ORAL_CAPSULE | Freq: Every day | ORAL | 0 refills | Status: AC

## 2022-03-15 NOTE — Telephone Encounter (Signed)
Patient called and was with her mother last Friday. Mother has the flu and patient would like some Tamflu to prevent getting the flu. Patient takes care of grand son that has cancer.

## 2022-03-15 NOTE — Telephone Encounter (Signed)
Call pt I sent her a mychart message and sent in tamiflu

## 2022-03-15 NOTE — Telephone Encounter (Signed)
LMV .. sent her a mychart message and sent in tamiflu

## 2022-03-15 NOTE — Progress Notes (Signed)
close

## 2022-03-17 NOTE — Telephone Encounter (Signed)
LVM to call back to office  

## 2022-05-05 ENCOUNTER — Other Ambulatory Visit: Payer: Self-pay | Admitting: Family

## 2022-05-05 DIAGNOSIS — I10 Essential (primary) hypertension: Secondary | ICD-10-CM

## 2022-08-17 ENCOUNTER — Other Ambulatory Visit: Payer: Self-pay | Admitting: Family

## 2022-08-17 DIAGNOSIS — I10 Essential (primary) hypertension: Secondary | ICD-10-CM

## 2022-09-27 ENCOUNTER — Telehealth: Payer: Self-pay | Admitting: Family

## 2022-09-27 DIAGNOSIS — I10 Essential (primary) hypertension: Secondary | ICD-10-CM

## 2022-09-27 MED ORDER — LISINOPRIL 10 MG PO TABS: 10.0000 mg | ORAL_TABLET | Freq: Every day | ORAL | 1 refills | Status: DC

## 2022-09-27 MED ORDER — AMLODIPINE BESYLATE 10 MG PO TABS: 10.0000 mg | ORAL_TABLET | Freq: Every day | ORAL | 1 refills | Status: DC

## 2022-09-27 NOTE — Addendum Note (Signed)
Addended by: [place], Phat Dalton on: 09/27/2022 12:08 PM   Modules accepted: Orders

## 2022-09-27 NOTE — Telephone Encounter (Signed)
RX sent in pt is aware 

## 2022-09-27 NOTE — Telephone Encounter (Signed)
Prescription Request  09/27/2022  LOV: 11/17/2021  What is the name of the medication or equipment? amLODipine (NORVASC) 10 MG tablet  lisinopril (ZESTRIL) 10 MG tablet    Have you contacted your pharmacy to request a refill? Yes  Which pharmacy would you like this sent to?  [id number] - [place], [place] - [address] [place] [address] Phone: [phone] Fax: [phone]    Patient notified that their request is being sent to the clinical staff for review and that they should receive a response within 2 business days.   Please advise at [facility] [phone]

## 2022-11-25 ENCOUNTER — Encounter (INDEPENDENT_AMBULATORY_CARE_PROVIDER_SITE_OTHER): Payer: Self-pay

## 2022-12-21 ENCOUNTER — Encounter: Payer: Self-pay | Admitting: Family

## 2022-12-21 ENCOUNTER — Ambulatory Visit (INDEPENDENT_AMBULATORY_CARE_PROVIDER_SITE_OTHER): Payer: BC Managed Care – PPO | Admitting: Family

## 2022-12-21 VITALS — BP 130/76 | HR 82 | Temp 98.8°F | Ht 63.0 in | Wt 177.8 lb

## 2022-12-21 DIAGNOSIS — Z0000 Encounter for general adult medical examination without abnormal findings: Secondary | ICD-10-CM | POA: Diagnosis not present

## 2022-12-21 DIAGNOSIS — R42 Dizziness and giddiness: Secondary | ICD-10-CM

## 2022-12-21 DIAGNOSIS — I10 Essential (primary) hypertension: Secondary | ICD-10-CM | POA: Diagnosis not present

## 2022-12-21 DIAGNOSIS — Z23 Encounter for immunization: Secondary | ICD-10-CM

## 2022-12-21 LAB — COMPREHENSIVE METABOLIC PANEL
ALT: 11 U/L (ref 0–35)
AST: 13 U/L (ref 0–37)
Albumin: 4.5 g/dL (ref 3.5–5.2)
Alkaline Phosphatase: 76 U/L (ref 39–117)
BUN: 12 mg/dL (ref 6–23)
CO2: 27 meq/L (ref 19–32)
Calcium: 9.8 mg/dL (ref 8.4–10.5)
Chloride: 102 meq/L (ref 96–112)
Creatinine, Ser: 0.76 mg/dL (ref 0.40–1.20)
GFR: 84.1 mL/min (ref >60.00)
Glucose, Bld: 108 mg/dL — ABNORMAL HIGH (ref 70–99)
Potassium: 4 meq/L (ref 3.5–5.1)
Sodium: 138 meq/L (ref 135–145)
Total Bilirubin: 0.4 mg/dL (ref 0.2–1.2)
Total Protein: 7.8 g/dL (ref 6.0–8.3)

## 2022-12-21 LAB — URINALYSIS, ROUTINE W REFLEX MICROSCOPIC
Bilirubin Urine: NEGATIVE
Ketones, ur: NEGATIVE
Nitrite: NEGATIVE
Specific Gravity, Urine: 1.01 (ref 1.000–1.030)
Total Protein, Urine: NEGATIVE
Urine Glucose: NEGATIVE
Urobilinogen, UA: 0.2 (ref 0.0–1.0)
pH: 6.5 (ref 5.0–8.0)

## 2022-12-21 LAB — CBC WITH DIFFERENTIAL/PLATELET
Basophils Absolute: 0 10*3/uL (ref 0.0–0.1)
Basophils Relative: 0.5 % (ref 0.0–3.0)
Eosinophils Absolute: 0.2 10*3/uL (ref 0.0–0.7)
Eosinophils Relative: 3.1 % (ref 0.0–5.0)
HCT: 43.7 % (ref 36.0–46.0)
Hemoglobin: 14.3 g/dL (ref 12.0–15.0)
Lymphocytes Relative: 33.8 % (ref 12.0–46.0)
Lymphs Abs: 1.8 10*3/uL (ref 0.7–4.0)
MCHC: 32.8 g/dL (ref 30.0–36.0)
MCV: 91.2 fl (ref 78.0–100.0)
Monocytes Absolute: 0.3 10*3/uL (ref 0.1–1.0)
Monocytes Relative: 6.1 % (ref 3.0–12.0)
Neutro Abs: 3 10*3/uL (ref 1.4–7.7)
Neutrophils Relative %: 56.5 % (ref 43.0–77.0)
Platelets: 287 10*3/uL (ref 150.0–400.0)
RBC: 4.79 Mil/uL (ref 3.87–5.11)
RDW: 13.2 % (ref 11.5–15.5)
WBC: 5.3 10*3/uL (ref 4.0–10.5)

## 2022-12-21 LAB — LIPID PANEL
Cholesterol: 228 mg/dL — ABNORMAL HIGH (ref 0–200)
HDL: 54.9 mg/dL (ref >39.00)
LDL Cholesterol: 122 mg/dL — ABNORMAL HIGH (ref 0–99)
NonHDL: 172.81
Total CHOL/HDL Ratio: 4
Triglycerides: 256 mg/dL — ABNORMAL HIGH (ref 0.0–149.0)
VLDL: 51.2 mg/dL — ABNORMAL HIGH (ref 0.0–40.0)

## 2022-12-21 LAB — VITAMIN D 25 HYDROXY (VIT D DEFICIENCY, FRACTURES): VITD: 29.78 ng/mL — ABNORMAL LOW (ref 30.00–100.00)

## 2022-12-21 LAB — TSH: TSH: 1.92 u[IU]/mL (ref 0.35–5.50)

## 2022-12-21 MED ORDER — MECLIZINE HCL 12.5 MG PO TABS: 12.5000 mg | ORAL_TABLET | Freq: Three times a day (TID) | ORAL | 0 refills | Status: AC | PRN

## 2022-12-21 MED ORDER — AMLODIPINE BESYLATE 10 MG PO TABS: 10.0000 mg | ORAL_TABLET | Freq: Every day | ORAL | 1 refills | Status: DC

## 2022-12-21 MED ORDER — AMLODIPINE BESYLATE 10 MG PO TABS: 10.0000 mg | ORAL_TABLET | Freq: Every day | ORAL | 3 refills | Status: DC

## 2022-12-21 MED ORDER — LISINOPRIL 10 MG PO TABS: 10.0000 mg | ORAL_TABLET | Freq: Every day | ORAL | 1 refills | Status: DC

## 2022-12-21 NOTE — Assessment & Plan Note (Signed)
Offered condolences to her in her family's time of such loss.   Discussed counseling and patient politely declines at this time.  She will reach out to me if she feels this is something she could benefit from.  She has strong family support and friends.  Referral dermatology and have reiterated the importance of continued surveillance due to her history.  She will call [facility] when she is ready to schedule her colonoscopy.  She will schedule mammogram.  She  is no longer following with GYN and will schedule a Pap smear with me in the near future.

## 2022-12-21 NOTE — Patient Instructions (Addendum)
Call [facility] Dr Allegra Lai [phone] when you are ready to schedule colonoscopy  Please call  and schedule your 3D mammogram and /or bone density scan as we discussed.   [facility]  ( new location in 2023)  [address], [place], [place] [address]  [place], [place]  [phone]   Health Maintenance for Postmenopausal Women Menopause is a normal process in which your ability to get pregnant comes to an end. This process happens slowly over many months or years, usually between the ages of 24 and 11. Menopause is complete when you have missed your menstrual period for 12 months. It is important to talk with your health care provider about some of the most common conditions that affect women after menopause (postmenopausal women). These include heart disease, cancer, and bone loss (osteoporosis). Adopting a healthy lifestyle and getting preventive care can help to promote your health and wellness. The actions you take can also lower your chances of developing some of these common conditions. What are the signs and symptoms of menopause? During menopause, you may have the following symptoms: Hot flashes. These can be moderate or severe. Night sweats. Decrease in sex drive. Mood swings. Headaches. Tiredness (fatigue). Irritability. Memory problems. Problems falling asleep or staying asleep. Talk with your health care provider about treatment options for your symptoms. Do I need hormone replacement therapy? Hormone replacement therapy is effective in treating symptoms that are caused by menopause, such as hot flashes and night sweats. Hormone replacement carries certain risks, especially as you become older. If you are thinking about using estrogen or estrogen with progestin, discuss the benefits and risks with your health care provider. How can I reduce my risk for heart disease and stroke? The risk of heart disease, heart attack, and stroke increases as you age.  One of the causes may be a change in the body's hormones during menopause. This can affect how your body uses dietary fats, triglycerides, and cholesterol. Heart attack and stroke are medical emergencies. There are many things that you can do to help prevent heart disease and stroke. Watch your blood pressure High blood pressure causes heart disease and increases the risk of stroke. This is more likely to develop in people who have high blood pressure readings or are overweight. Have your blood pressure checked: Every 3-5 years if you are 86-34 years of age. Every year if you are 38 years old or older. Eat a healthy diet  Eat a diet that includes plenty of vegetables, fruits, low-fat dairy products, and lean protein. Do not eat a lot of foods that are high in solid fats, added sugars, or sodium. Get regular exercise Get regular exercise. This is one of the most important things you can do for your health. Most adults should: Try to exercise for at least 150 minutes each week. The exercise should increase your heart rate and make you sweat (moderate-intensity exercise). Try to do strengthening exercises at least twice each week. Do these in addition to the moderate-intensity exercise. Spend less time sitting. Even light physical activity can be beneficial. Other tips Work with your health care provider to achieve or maintain a healthy weight. Do not use any products that contain nicotine or tobacco. These products include cigarettes, chewing tobacco, and vaping devices, such as e-cigarettes. If you need help quitting, ask your health care provider. Know your numbers. Ask your health care provider to check your cholesterol and your blood sugar (glucose). Continue to have your  blood tested as directed by your health care provider. Do I need screening for cancer? Depending on your health history and family history, you may need to have cancer screenings at different stages of your life. This may  include screening for: Breast cancer. Cervical cancer. Lung cancer. Colorectal cancer. What is my risk for osteoporosis? After menopause, you may be at increased risk for osteoporosis. Osteoporosis is a condition in which bone destruction happens more quickly than new bone creation. To help prevent osteoporosis or the bone fractures that can happen because of osteoporosis, you may take the following actions: If you are 88-78 years old, get at least 1,000 mg of calcium and at least 600 international units (IU) of vitamin D per day. If you are older than age 59 but younger than age 12, get at least 1,200 mg of calcium and at least 600 international units (IU) of vitamin D per day. If you are older than age 52, get at least 1,200 mg of calcium and at least 800 international units (IU) of vitamin D per day. Smoking and drinking excessive alcohol increase the risk of osteoporosis. Eat foods that are rich in calcium and vitamin D, and do weight-bearing exercises several times each week as directed by your health care provider. How does menopause affect my mental health? Depression may occur at any age, but it is more common as you become older. Common symptoms of depression include: Feeling depressed. Changes in sleep patterns. Changes in appetite or eating patterns. Feeling an overall lack of motivation or enjoyment of activities that you previously enjoyed. Frequent crying spells. Talk with your health care provider if you think that you are experiencing any of these symptoms. General instructions See your health care provider for regular wellness exams and vaccines. This may include: Scheduling regular health, dental, and eye exams. Getting and maintaining your vaccines. These include: Influenza vaccine. Get this vaccine each year before the flu season begins. Pneumonia vaccine. Shingles vaccine. Tetanus, diphtheria, and pertussis (Tdap) booster vaccine. Your health care provider may also  recommend other immunizations. Tell your health care provider if you have ever been abused or do not feel safe at home. Summary Menopause is a normal process in which your ability to get pregnant comes to an end. This condition causes hot flashes, night sweats, decreased interest in sex, mood swings, headaches, or lack of sleep. Treatment for this condition may include hormone replacement therapy. Take actions to keep yourself healthy, including exercising regularly, eating a healthy diet, watching your weight, and checking your blood pressure and blood sugar levels. Get screened for cancer and depression. Make sure that you are up to date with all your vaccines. This information is not intended to replace advice given to you by your health care provider. Make sure you discuss any questions you have with your health care provider. Document Revised: 08/18/2020 Document Reviewed: 08/18/2020 Elsevier Patient Education  2024 [employer].

## 2022-12-21 NOTE — Assessment & Plan Note (Signed)
Chronic, stable.  continue amlodipine 10 mg, lisinopril 10 mg.

## 2022-12-21 NOTE — Progress Notes (Signed)
Assessment & Plan:  Routine physical examination Assessment & Plan: Offered condolences to her in her family's time of such loss.   Discussed counseling and patient politely declines at this time.  She will reach out to me if she feels this is something she could benefit from.  She has strong family support and friends.  Referral dermatology and have reiterated the importance of continued surveillance due to her history.  She will call [facility] when she is ready to schedule her colonoscopy.  She will schedule mammogram.  She  is no longer following with GYN and will schedule a Pap smear with me in the near future.  Orders: -     VITAMIN D 25 Hydroxy (Vit-D Deficiency, Fractures) -     CBC with Differential/Platelet -     Comprehensive metabolic panel -     Lipid panel -     TSH -     3D Screening Mammogram, Left and Right; Future -     Urinalysis, Routine w reflex microscopic -     Ambulatory referral to Dermatology -     amLODIPine Besylate; Take 1 tablet (10 mg total) by mouth daily.  Dispense: 90 tablet; Refill: 3  Essential hypertension, benign Assessment & Plan: Chronic, stable.  continue amlodipine 10 mg, lisinopril 10 mg.  Orders: -     Lisinopril; Take 1 tablet (10 mg total) by mouth daily.  Dispense: 90 tablet; Refill: 1 -     amLODIPine Besylate; Take 1 tablet (10 mg total) by mouth daily.  Dispense: 90 tablet; Refill: 3  Vertigo -     Meclizine HCl; Take 1 tablet (12.5 mg total) by mouth 3 (three) times daily as needed for dizziness.  Dispense: 30 tablet; Refill: 0  Encounter for immunization -     Flu vaccine trivalent PF, 6mos and older(Flulaval,Afluria,Fluarix,Fluzone)     Return precautions given.   Risks, benefits, and alternatives of the medications and treatment plan prescribed today were discussed, and patient expressed understanding.   Education regarding symptom management and diagnosis given to patient on AVS either electronically or  printed.  Return in about 3 months (around 03/22/2023).  Rennie Plowman, FNP  Subjective:    Patient ID: [name], female    DOB: 1961-01-30, 62 y.o.   MRN: [id number]  CC: [name] is a 62 y.o. female who presents today for physical exam.    HPI: Grieving loss of 72 year old grandson.  She has strong family support and friends.   She is no longer following with dermatology; history of basal cell carcinoma, squamous cell carcinoma  Colorectal Cancer Screening: due Breast Cancer Screening: Mammogram UTD Cervical Cancer Screening: reported done 2 years ago; follows with OB GYN   Bone Health screening/DEXA for 65+: No increased fracture risk. Defer screening at this time.  Lung Cancer Screening: Doesn't have 20 year pack year history and age > 35 years yo 72 years        Tetanus - due         Exercise: Gets regular exercise.   Alcohol use:  occassional Smoking/tobacco use: Nonsmoker.    Health Maintenance  Topic Date Due   DTaP/Tdap/Td vaccine (1 - Tdap) Never done   Zoster (Shingles) Vaccine (1 of 2) Never done   Colon Cancer Screening  03/16/2022   COVID-19 Vaccine (3 - 2023-24 season) 12/12/2022   Pap Smear  01/11/2023   Mammogram  03/11/2024   Flu Shot  Completed   HPV Vaccine  Aged  Out   Hepatitis C Screening  Discontinued   HIV Screening  Discontinued    ALLERGIES: Patient has no known allergies.  No current outpatient medications on file prior to visit.   No current facility-administered medications on file prior to visit.    Review of Systems  Constitutional:  Negative for chills, fever and unexpected weight change.  HENT:  Negative for congestion.   Respiratory:  Negative for cough.   Cardiovascular:  Negative for chest pain, palpitations and leg swelling.  Gastrointestinal:  Negative for nausea and vomiting.  Musculoskeletal:  Negative for arthralgias and myalgias.  Skin:  Negative for rash.  Neurological:  Negative for headaches.   Hematological:  Negative for adenopathy.  Psychiatric/Behavioral:  Negative for confusion.       Objective:    BP 130/76   Pulse 82   Temp 98.8 F (37.1 C) (Oral)   Ht 5\' 3"  (1.6 m)   Wt 177 lb 12.8 oz (80.6 kg)   LMP 01/11/2015 (Approximate)   SpO2 98%   BMI 31.50 kg/m   BP Readings from Last 3 Encounters:  12/21/22 130/76  11/17/21 138/86  10/27/20 129/89   Wt Readings from Last 3 Encounters:  12/21/22 177 lb 12.8 oz (80.6 kg)  11/17/21 175 lb 12.8 oz (79.7 kg)  10/27/20 173 lb 6.4 oz (78.7 kg)    Physical Exam Vitals reviewed.  Constitutional:      Appearance: Normal appearance. She is well-developed.  Eyes:     Conjunctiva/sclera: Conjunctivae normal.  Neck:     Thyroid: No thyroid mass or thyromegaly.  Cardiovascular:     Rate and Rhythm: Normal rate and regular rhythm.     Pulses: Normal pulses.     Heart sounds: Normal heart sounds.  Pulmonary:     Effort: Pulmonary effort is normal.     Breath sounds: Normal breath sounds. No wheezing, rhonchi or rales.  Chest:  Breasts:    Breasts are symmetrical.     Right: No inverted nipple, mass, nipple discharge, skin change or tenderness.     Left: No inverted nipple, mass, nipple discharge, skin change or tenderness.  Abdominal:     General: Bowel sounds are normal. There is no distension.     Palpations: Abdomen is soft. Abdomen is not rigid. There is no fluid wave or mass.     Tenderness: There is no abdominal tenderness. There is no guarding or rebound.  Lymphadenopathy:     Head:     Right side of head: No submental, submandibular, tonsillar, preauricular, posterior auricular or occipital adenopathy.     Left side of head: No submental, submandibular, tonsillar, preauricular, posterior auricular or occipital adenopathy.     Cervical: No cervical adenopathy.     Right cervical: No superficial, deep or posterior cervical adenopathy.    Left cervical: No superficial, deep or posterior cervical adenopathy.   Skin:    General: Skin is warm and dry.  Neurological:     Mental Status: She is alert.  Psychiatric:        Speech: Speech normal.        Behavior: Behavior normal.        Thought Content: Thought content normal.

## 2022-12-28 ENCOUNTER — Telehealth: Payer: Self-pay

## 2022-12-28 NOTE — Telephone Encounter (Signed)
LVM on number  provided..([phone] for someone to call me back to get pap results from Dr Jean Rosenthal from  2022

## 2022-12-30 ENCOUNTER — Encounter: Payer: Self-pay | Admitting: Family

## 2022-12-30 ENCOUNTER — Telehealth: Payer: Self-pay

## 2022-12-30 NOTE — Telephone Encounter (Signed)
Lvm for pt to give office a call back in regards to labs

## 2022-12-30 NOTE — Telephone Encounter (Signed)
-----   Message from Rennie Plowman sent at 12/30/2022  2:15 PM EDT ----- Please call patient and review result notes.  She prefers to be called over [employer]

## 2023-01-06 ENCOUNTER — Encounter: Payer: Self-pay | Admitting: *Deleted

## 2023-03-16 ENCOUNTER — Ambulatory Visit
Admission: RE | Admit: 2023-03-16 | Discharge: 2023-03-16 | Disposition: A | Discharge status: Home / Self Care | Payer: BC Managed Care – PPO | Source: Ambulatory Visit | Attending: Family | Admitting: Family

## 2023-03-16 DIAGNOSIS — Z1231 Encounter for screening mammogram for malignant neoplasm of breast: Secondary | ICD-10-CM | POA: Diagnosis not present

## 2023-03-16 DIAGNOSIS — Z0000 Encounter for general adult medical examination without abnormal findings: Secondary | ICD-10-CM

## 2023-04-25 DIAGNOSIS — D2271 Melanocytic nevi of right lower limb, including hip: Secondary | ICD-10-CM | POA: Diagnosis not present

## 2023-04-25 DIAGNOSIS — X32XXXA Exposure to sunlight, initial encounter: Secondary | ICD-10-CM | POA: Diagnosis not present

## 2023-04-25 DIAGNOSIS — L820 Inflamed seborrheic keratosis: Secondary | ICD-10-CM | POA: Diagnosis not present

## 2023-04-25 DIAGNOSIS — L570 Actinic keratosis: Secondary | ICD-10-CM | POA: Diagnosis not present

## 2023-04-25 DIAGNOSIS — D2262 Melanocytic nevi of left upper limb, including shoulder: Secondary | ICD-10-CM | POA: Diagnosis not present

## 2023-04-25 DIAGNOSIS — C44311 Basal cell carcinoma of skin of nose: Secondary | ICD-10-CM | POA: Diagnosis not present

## 2023-04-25 DIAGNOSIS — D485 Neoplasm of uncertain behavior of skin: Secondary | ICD-10-CM | POA: Diagnosis not present

## 2023-04-25 DIAGNOSIS — D2261 Melanocytic nevi of right upper limb, including shoulder: Secondary | ICD-10-CM | POA: Diagnosis not present

## 2023-04-25 DIAGNOSIS — D225 Melanocytic nevi of trunk: Secondary | ICD-10-CM | POA: Diagnosis not present

## 2023-06-15 ENCOUNTER — Other Ambulatory Visit: Payer: Self-pay | Admitting: Family

## 2023-06-15 DIAGNOSIS — I10 Essential (primary) hypertension: Secondary | ICD-10-CM

## 2023-07-25 DIAGNOSIS — C44311 Basal cell carcinoma of skin of nose: Secondary | ICD-10-CM | POA: Diagnosis not present

## 2023-07-25 DIAGNOSIS — L578 Other skin changes due to chronic exposure to nonionizing radiation: Secondary | ICD-10-CM | POA: Diagnosis not present

## 2023-07-25 DIAGNOSIS — L814 Other melanin hyperpigmentation: Secondary | ICD-10-CM | POA: Diagnosis not present

## 2023-10-24 DIAGNOSIS — D2272 Melanocytic nevi of left lower limb, including hip: Secondary | ICD-10-CM | POA: Diagnosis not present

## 2023-10-24 DIAGNOSIS — D225 Melanocytic nevi of trunk: Secondary | ICD-10-CM | POA: Diagnosis not present

## 2023-10-24 DIAGNOSIS — D2262 Melanocytic nevi of left upper limb, including shoulder: Secondary | ICD-10-CM | POA: Diagnosis not present

## 2023-10-24 DIAGNOSIS — D2261 Melanocytic nevi of right upper limb, including shoulder: Secondary | ICD-10-CM | POA: Diagnosis not present

## 2023-12-09 ENCOUNTER — Other Ambulatory Visit: Payer: Self-pay | Admitting: Family

## 2023-12-09 DIAGNOSIS — I10 Essential (primary) hypertension: Secondary | ICD-10-CM

## 2023-12-29 ENCOUNTER — Encounter: Payer: Self-pay | Admitting: Family

## 2023-12-29 ENCOUNTER — Ambulatory Visit (INDEPENDENT_AMBULATORY_CARE_PROVIDER_SITE_OTHER): Admitting: Family

## 2023-12-29 VITALS — BP 141/90 | HR 94 | Temp 98.3°F | Ht 63.0 in | Wt 174.8 lb

## 2023-12-29 DIAGNOSIS — Z23 Encounter for immunization: Secondary | ICD-10-CM

## 2023-12-29 DIAGNOSIS — Z0000 Encounter for general adult medical examination without abnormal findings: Secondary | ICD-10-CM | POA: Diagnosis not present

## 2023-12-29 DIAGNOSIS — I10 Essential (primary) hypertension: Secondary | ICD-10-CM

## 2023-12-29 LAB — LIPID PANEL
Cholesterol: 236 mg/dL — ABNORMAL HIGH (ref 0–200)
HDL: 51.9 mg/dL (ref >39.00)
LDL Cholesterol: 153 mg/dL — ABNORMAL HIGH (ref 0–99)
NonHDL: 184.41
Total CHOL/HDL Ratio: 5
Triglycerides: 159 mg/dL — ABNORMAL HIGH (ref 0.0–149.0)
VLDL: 31.8 mg/dL (ref 0.0–40.0)

## 2023-12-29 LAB — COMPREHENSIVE METABOLIC PANEL WITH GFR
ALT: 13 U/L (ref 0–35)
AST: 13 U/L (ref 0–37)
Albumin: 4.8 g/dL (ref 3.5–5.2)
Alkaline Phosphatase: 73 U/L (ref 39–117)
BUN: 19 mg/dL (ref 6–23)
CO2: 27 meq/L (ref 19–32)
Calcium: 9.7 mg/dL (ref 8.4–10.5)
Chloride: 100 meq/L (ref 96–112)
Creatinine, Ser: 0.77 mg/dL (ref 0.40–1.20)
GFR: 82.2 mL/min (ref >60.00)
Glucose, Bld: 110 mg/dL — ABNORMAL HIGH (ref 70–99)
Potassium: 3.9 meq/L (ref 3.5–5.1)
Sodium: 137 meq/L (ref 135–145)
Total Bilirubin: 0.6 mg/dL (ref 0.2–1.2)
Total Protein: 7.7 g/dL (ref 6.0–8.3)

## 2023-12-29 LAB — CBC WITH DIFFERENTIAL/PLATELET
Basophils Absolute: 0 K/uL (ref 0.0–0.1)
Basophils Relative: 0.4 % (ref 0.0–3.0)
Eosinophils Absolute: 0.1 K/uL (ref 0.0–0.7)
Eosinophils Relative: 2.8 % (ref 0.0–5.0)
HCT: 41.2 % (ref 36.0–46.0)
Hemoglobin: 13.8 g/dL (ref 12.0–15.0)
Lymphocytes Relative: 34.3 % (ref 12.0–46.0)
Lymphs Abs: 1.6 K/uL (ref 0.7–4.0)
MCHC: 33.5 g/dL (ref 30.0–36.0)
MCV: 88 fl (ref 78.0–100.0)
Monocytes Absolute: 0.4 K/uL (ref 0.1–1.0)
Monocytes Relative: 7.7 % (ref 3.0–12.0)
Neutro Abs: 2.5 K/uL (ref 1.4–7.7)
Neutrophils Relative %: 54.8 % (ref 43.0–77.0)
Platelets: 287 K/uL (ref 150.0–400.0)
RBC: 4.68 Mil/uL (ref 3.87–5.11)
RDW: 13.7 % (ref 11.5–15.5)
WBC: 4.6 K/uL (ref 4.0–10.5)

## 2023-12-29 LAB — TSH: TSH: 1.99 u[IU]/mL (ref 0.35–5.50)

## 2023-12-29 LAB — VITAMIN D 25 HYDROXY (VIT D DEFICIENCY, FRACTURES): VITD: 32.47 ng/mL (ref 30.00–100.00)

## 2023-12-29 LAB — HEMOGLOBIN A1C: Hgb A1c MFr Bld: 6.4 % (ref 4.6–6.5)

## 2023-12-29 NOTE — Assessment & Plan Note (Signed)
 Clinical breast exam performed.  Patient politely declines cervical cancer screening today.  Encouraged her to have this done at our next physical.  Colonoscopy is also due.  She will let me know when she is ready for this referral.

## 2023-12-29 NOTE — Progress Notes (Signed)
 Assessment & Plan:  Routine physical examination Assessment & Plan: Clinical breast exam performed.  Patient politely declines cervical cancer screening today.  Encouraged her to have this done at our next physical.  Colonoscopy is also due.  She will let me know when she is ready for this referral.  Orders: -     VITAMIN D  25 Hydroxy (Vit-D Deficiency, Fractures) -     Hemoglobin A1c -     CBC with Differential/Platelet -     Comprehensive metabolic panel with GFR -     Lipid panel -     TSH -     3D Screening Mammogram, Left and Right; Future -     Flu vaccine trivalent PF, 6mos and older(Flulaval,Afluria,Fluarix,Fluzone) -     Measles/Mumps/Rubella Immunity  Essential hypertension, benign Assessment & Plan: Elevated today.  Previously blood pressure under much better control.  Prior to escalating regimen, patient and I agreed it is prudent to keep blood pressure log at home.  She will call me with readings we can further adjust medication.  For now, continue amlodipine  10 mg, lisinopril  10 mg.   Need for diphtheria-tetanus-pertussis (Tdap) vaccine -     Tdap vaccine greater than or equal to 7yo IM     Return precautions given.   Risks, benefits, and alternatives of the medications and treatment plan prescribed today were discussed, and patient expressed understanding.   Education regarding symptom management and diagnosis given to patient on AVS either electronically or printed.  No follow-ups on file.  Rollene Northern, FNP  Subjective:    Patient ID: Alfonso Ang, female    DOB: 1961-03-16, 63 y.o.   MRN: [id number]  CC: Kira Hartl is a 63 y.o. female who presents today for physical exam.    HPI: Feels well today  No new complaints.   BP at home 137/ ( unsure of lower number).  Denies CP, sob  She is compliant with lisinopril  10mg , amlodipine  10mg .    H/o SCC, BCC; s/p MOHs with Dr Wenona  Mother and father with bladder cancer  Colorectal Cancer  Screening: due, 03/2017 , repeat in 5 years  Breast Cancer Screening: Mammogram UTD Cervical Cancer Screening: due ; previously followed with Dr Leonce; reported pap smear 2021; 11/09/2016 NILM, neg HPV  Bone Health screening/DEXA for 65+: No increased fracture risk. Defer screening at this time.  Lung Cancer Screening: Doesn't have 20 year pack year history and age > 64 years yo 62 years        Tetanus - due        Pneumococcal - Candidate for; declines  Exercise: Gets regular exercise.   Alcohol use:  occassional wine Smoking/tobacco use: Nonsmoker.    Health Maintenance  Topic Date Due   COVID-19 Vaccine (3 - Pfizer risk series) 08/09/2019   Pap with HPV screening  01/11/2023   Zoster (Shingles) Vaccine (1 of 2) 03/29/2024*   Pneumococcal Vaccine for age over 28 (1 of 1 - PCV) 12/28/2024*   Colon Cancer Screening  12/28/2024*   Breast Cancer Screening  03/15/2025   DTaP/Tdap/Td vaccine (2 - Td or Tdap) 12/28/2033   Flu Shot  Completed   Hepatitis B Vaccine  Aged Out   HPV Vaccine  Aged Out   Meningitis B Vaccine  Aged Out   Hepatitis C Screening  Discontinued   HIV Screening  Discontinued  *Topic was postponed. The date shown is not the original due date.    ALLERGIES: Patient has no known  allergies.  Current Outpatient Medications on File Prior to Visit  Medication Sig Dispense Refill   amLODipine  (NORVASC ) 10 MG tablet Take 1 tablet (10 mg total) by mouth daily. 90 tablet 3   lisinopril  (ZESTRIL ) 10 MG tablet TAKE 1 TABLET BY MOUTH EVERY DAY 90 tablet 1   meclizine  (ANTIVERT ) 12.5 MG tablet Take 1 tablet (12.5 mg total) by mouth 3 (three) times daily as needed for dizziness. 30 tablet 0   No current facility-administered medications on file prior to visit.    Review of Systems  Constitutional:  Negative for chills, fever and unexpected weight change.  HENT:  Negative for congestion.   Respiratory:  Negative for cough.   Cardiovascular:  Negative for chest pain,  palpitations and leg swelling.  Gastrointestinal:  Negative for nausea and vomiting.  Musculoskeletal:  Negative for arthralgias and myalgias.  Skin:  Negative for rash.  Neurological:  Negative for headaches.  Hematological:  Negative for adenopathy.  Psychiatric/Behavioral:  Negative for confusion.       Objective:    BP (!) 141/90   Pulse 94   Temp 98.3 F (36.8 C) (Oral)   Ht 5' 3 (1.6 m)   Wt 174 lb 12.8 oz (79.3 kg)   LMP 01/11/2015 (Approximate)   SpO2 96%   BMI 30.96 kg/m   BP Readings from Last 3 Encounters:  12/29/23 (!) 141/90  12/21/22 130/76  11/17/21 138/86   Wt Readings from Last 3 Encounters:  12/29/23 174 lb 12.8 oz (79.3 kg)  12/21/22 177 lb 12.8 oz (80.6 kg)  11/17/21 175 lb 12.8 oz (79.7 kg)    Physical Exam Vitals reviewed.  Constitutional:      Appearance: She is well-developed.  Eyes:     Conjunctiva/sclera: Conjunctivae normal.  Cardiovascular:     Rate and Rhythm: Normal rate and regular rhythm.     Pulses: Normal pulses.     Heart sounds: Normal heart sounds.  Pulmonary:     Effort: Pulmonary effort is normal.     Breath sounds: Normal breath sounds. No wheezing, rhonchi or rales.  Skin:    General: Skin is warm and dry.  Neurological:     Mental Status: She is alert.  Psychiatric:        Speech: Speech normal.        Behavior: Behavior normal.        Thought Content: Thought content normal.

## 2023-12-29 NOTE — Assessment & Plan Note (Addendum)
 Elevated today.  Previously blood pressure under much better control.  Prior to escalating regimen, patient and I agreed it is prudent to keep blood pressure log at home.  She will call me with readings we can further adjust medication.  For now, continue amlodipine  10 mg, lisinopril  10 mg.

## 2023-12-29 NOTE — Patient Instructions (Addendum)
 Please let me know when ready for colonoscopy referral  It is imperative that you are seen AT least twice per year for labs and monitoring. Monitor blood pressure at home and me 5-6 reading on separate days. Goal is less than 120/80, based on newest guidelines, however we certainly want to be less than 130/80;  if persistently higher, please make sooner follow up appointment so we can recheck you blood pressure and manage/ adjust medications.   Managing Your Hypertension Hypertension, also called high blood pressure, is when the force of the blood pressing against the walls of the arteries is too strong. Arteries are blood vessels that carry blood from your heart throughout your body. Hypertension forces the heart to work harder to pump blood and may cause the arteries to become narrow or stiff. Understanding blood pressure readings A blood pressure reading includes a higher number over a lower number: The first, or top, number is called the systolic pressure. It is a measure of the pressure in your arteries as your heart beats. The second, or bottom number, is called the diastolic pressure. It is a measure of the pressure in your arteries as the heart relaxes. For most people, a normal blood pressure is below 120/80. Your personal target blood pressure may vary depending on your medical conditions, your age, and other factors. Blood pressure is classified into four stages. Based on your blood pressure reading, your health care provider may use the following stages to determine what type of treatment you need, if any. Systolic pressure and diastolic pressure are measured in a unit called millimeters of mercury (mmHg). Normal Systolic pressure: below 120. Diastolic pressure: below 80. Elevated Systolic pressure: 120-129. Diastolic pressure: below 80. Hypertension stage 1 Systolic pressure: 130-139. Diastolic pressure: 80-89. Hypertension stage 2 Systolic pressure: 140 or above. Diastolic  pressure: 90 or above. How can this condition affect me? Managing your hypertension is very important. Over time, hypertension can damage the arteries and decrease blood flow to parts of the body, including the brain, heart, and kidneys. Having untreated or uncontrolled hypertension can lead to: A heart attack. A stroke. A weakened blood vessel (aneurysm). Heart failure. Kidney damage. Eye damage. Memory and concentration problems. Vascular dementia. What actions can I take to manage this condition? Hypertension can be managed by making lifestyle changes and possibly by taking medicines. Your health care provider will help you make a plan to bring your blood pressure within a normal range. You may be referred for counseling on a healthy diet and physical activity. Nutrition  Eat a diet that is high in fiber and potassium, and low in salt (sodium), added sugar, and fat. An example eating plan is called the DASH diet. DASH stands for Dietary Approaches to Stop Hypertension. To eat this way: Eat plenty of fresh fruits and vegetables. Try to fill one-half of your plate at each meal with fruits and vegetables. Eat whole grains, such as whole-wheat pasta, brown rice, or whole-grain bread. Fill about one-fourth of your plate with whole grains. Eat low-fat dairy products. Avoid fatty cuts of meat, processed or cured meats, and poultry with skin. Fill about one-fourth of your plate with lean proteins such as fish, chicken without skin, beans, eggs, and tofu. Avoid pre-made and processed foods. These tend to be higher in sodium, added sugar, and fat. Reduce your daily sodium intake. Many people with hypertension should eat less than 1,500 mg of sodium a day. Lifestyle  Work with your health care provider to maintain a  healthy body weight or to lose weight. Ask what an ideal weight is for you. Get at least 30 minutes of exercise that causes your heart to beat faster (aerobic exercise) most days of the  week. Activities may include walking, swimming, or biking. Include exercise to strengthen your muscles (resistance exercise), such as weight lifting, as part of your weekly exercise routine. Try to do these types of exercises for 30 minutes at least 3 days a week. Do not use any products that contain nicotine or tobacco. These products include cigarettes, chewing tobacco, and vaping devices, such as e-cigarettes. If you need help quitting, ask your health care provider. Control any long-term (chronic) conditions you have, such as high cholesterol or diabetes. Identify your sources of stress and find ways to manage stress. This may include meditation, deep breathing, or making time for fun activities. Alcohol use Do not drink alcohol if: Your health care provider tells you not to drink. You are pregnant, may be pregnant, or are planning to become pregnant. If you drink alcohol: Limit how much you have to: 0-1 drink a day for women. 0-2 drinks a day for men. Know how much alcohol is in your drink. In the [place] one drink equals one 12 oz bottle of beer (355 mL), one 5 oz glass of wine (148 mL), or one 1 oz glass of hard liquor (44 mL). Medicines Your health care provider may prescribe medicine if lifestyle changes are not enough to get your blood pressure under control and if: Your systolic blood pressure is 130 or higher. Your diastolic blood pressure is 80 or higher. Take medicines only as told by your health care provider. Follow the directions carefully. Blood pressure medicines must be taken as told by your health care provider. The medicine does not work as well when you skip doses. Skipping doses also puts you at risk for problems. Monitoring Before you monitor your blood pressure: Do not smoke, drink caffeinated beverages, or exercise within 30 minutes before taking a measurement. Use the bathroom and empty your bladder (urinate). Sit quietly for at least 5 minutes before taking  measurements. Monitor your blood pressure at home as told by your health care provider. To do this: Sit with your back straight and supported. Place your feet flat on the floor. Do not cross your legs. Support your arm on a flat surface, such as a table. Make sure your upper arm is at heart level. Each time you measure, take two or three readings one minute apart and record the results. You may also need to have your blood pressure checked regularly by your health care provider. General information Talk with your health care provider about your diet, exercise habits, and other lifestyle factors that may be contributing to hypertension. Review all the medicines you take with your health care provider because there may be side effects or interactions. Keep all follow-up visits. Your health care provider can help you create and adjust your plan for managing your high blood pressure. Where to find more information National Heart, Lung, and Blood Institute: [employer].is American Heart Association: www.heart.org Contact a health care provider if: You think you are having a reaction to medicines you have taken. You have repeated (recurrent) headaches. You feel dizzy. You have swelling in your ankles. You have trouble with your vision. Get help right away if: You develop a severe headache or confusion. You have unusual weakness or numbness, or you feel faint. You have severe pain in your chest or abdomen.  You vomit repeatedly. You have trouble breathing. These symptoms may be an emergency. Get help right away. Call 911. Do not wait to see if the symptoms will go away. Do not drive yourself to the hospital. Summary Hypertension is when the force of blood pumping through your arteries is too strong. If this condition is not controlled, it may put you at risk for serious complications. Your personal target blood pressure may vary depending on your medical conditions, your age, and other  factors. For most people, a normal blood pressure is less than 120/80. Hypertension is managed by lifestyle changes, medicines, or both. Lifestyle changes to help manage hypertension include losing weight, eating a healthy, low-sodium diet, exercising more, stopping smoking, and limiting alcohol. This information is not intended to replace advice given to you by your health care provider. Make sure you discuss any questions you have with your health care provider. Document Revised: 12/11/2020 Document Reviewed: 12/11/2020 Elsevier Patient Education  2024 [employer].Health Maintenance for Postmenopausal Women Menopause is a normal process in which your ability to get pregnant comes to an end. This process happens slowly over many months or years, usually between the ages of 62 and 1. Menopause is complete when you have missed your menstrual period for 12 months. It is important to talk with your health care provider about some of the most common conditions that affect women after menopause (postmenopausal women). These include heart disease, cancer, and bone loss (osteoporosis). Adopting a healthy lifestyle and getting preventive care can help to promote your health and wellness. The actions you take can also lower your chances of developing some of these common conditions. What are the signs and symptoms of menopause? During menopause, you may have the following symptoms: Hot flashes. These can be moderate or severe. Night sweats. Decrease in sex drive. Mood swings. Headaches. Tiredness (fatigue). Irritability. Memory problems. Problems falling asleep or staying asleep. Talk with your health care provider about treatment options for your symptoms. Do I need hormone replacement therapy? Hormone replacement therapy is effective in treating symptoms that are caused by menopause, such as hot flashes and night sweats. Hormone replacement carries certain risks, especially as you become older. If  you are thinking about using estrogen or estrogen with progestin, discuss the benefits and risks with your health care provider. How can I reduce my risk for heart disease and stroke? The risk of heart disease, heart attack, and stroke increases as you age. One of the causes may be a change in the body's hormones during menopause. This can affect how your body uses dietary fats, triglycerides, and cholesterol. Heart attack and stroke are medical emergencies. There are many things that you can do to help prevent heart disease and stroke. Watch your blood pressure High blood pressure causes heart disease and increases the risk of stroke. This is more likely to develop in people who have high blood pressure readings or are overweight. Have your blood pressure checked: Every 3-5 years if you are 47-37 years of age. Every year if you are 61 years old or older. Eat a healthy diet  Eat a diet that includes plenty of vegetables, fruits, low-fat dairy products, and lean protein. Do not eat a lot of foods that are high in solid fats, added sugars, or sodium. Get regular exercise Get regular exercise. This is one of the most important things you can do for your health. Most adults should: Try to exercise for at least 150 minutes each week. The exercise should  increase your heart rate and make you sweat (moderate-intensity exercise). Try to do strengthening exercises at least twice each week. Do these in addition to the moderate-intensity exercise. Spend less time sitting. Even light physical activity can be beneficial. Other tips Work with your health care provider to achieve or maintain a healthy weight. Do not use any products that contain nicotine or tobacco. These products include cigarettes, chewing tobacco, and vaping devices, such as e-cigarettes. If you need help quitting, ask your health care provider. Know your numbers. Ask your health care provider to check your cholesterol and your blood sugar  (glucose). Continue to have your blood tested as directed by your health care provider. Do I need screening for cancer? Depending on your health history and family history, you may need to have cancer screenings at different stages of your life. This may include screening for: Breast cancer. Cervical cancer. Lung cancer. Colorectal cancer. What is my risk for osteoporosis? After menopause, you may be at increased risk for osteoporosis. Osteoporosis is a condition in which bone destruction happens more quickly than new bone creation. To help prevent osteoporosis or the bone fractures that can happen because of osteoporosis, you may take the following actions: If you are 71-77 years old, get at least 1,000 mg of calcium and at least 600 international units (IU) of vitamin D  per day. If you are older than age 82 but younger than age 56, get at least 1,200 mg of calcium and at least 600 international units (IU) of vitamin D  per day. If you are older than age 68, get at least 1,200 mg of calcium and at least 800 international units (IU) of vitamin D  per day. Smoking and drinking excessive alcohol increase the risk of osteoporosis. Eat foods that are rich in calcium and vitamin D , and do weight-bearing exercises several times each week as directed by your health care provider. How does menopause affect my mental health? Depression may occur at any age, but it is more common as you become older. Common symptoms of depression include: Feeling depressed. Changes in sleep patterns. Changes in appetite or eating patterns. Feeling an overall lack of motivation or enjoyment of activities that you previously enjoyed. Frequent crying spells. Talk with your health care provider if you think that you are experiencing any of these symptoms. General instructions See your health care provider for regular wellness exams and vaccines. This may include: Scheduling regular health, dental, and eye exams. Getting and  maintaining your vaccines. These include: Influenza vaccine. Get this vaccine each year before the flu season begins. Pneumonia vaccine. Shingles vaccine. Tetanus, diphtheria, and pertussis (Tdap) booster vaccine. Your health care provider may also recommend other immunizations. Tell your health care provider if you have ever been abused or do not feel safe at home. Summary Menopause is a normal process in which your ability to get pregnant comes to an end. This condition causes hot flashes, night sweats, decreased interest in sex, mood swings, headaches, or lack of sleep. Treatment for this condition may include hormone replacement therapy. Take actions to keep yourself healthy, including exercising regularly, eating a healthy diet, watching your weight, and checking your blood pressure and blood sugar levels. Get screened for cancer and depression. Make sure that you are up to date with all your vaccines. This information is not intended to replace advice given to you by your health care provider. Make sure you discuss any questions you have with your health care provider. Document Revised: 08/18/2020 Document Reviewed:  08/18/2020 Elsevier Patient Education  2024 [employer].

## 2023-12-30 ENCOUNTER — Ambulatory Visit: Payer: Self-pay | Admitting: Family

## 2023-12-30 LAB — MEASLES/MUMPS/RUBELLA IMMUNITY
Mumps IgG: >300 [AU]/ml
Rubella: 14.8 {index}
Rubeola IgG: <13.5 [AU]/ml — ABNORMAL LOW

## 2024-03-09 ENCOUNTER — Other Ambulatory Visit: Payer: Self-pay | Admitting: Family

## 2024-03-09 DIAGNOSIS — I10 Essential (primary) hypertension: Secondary | ICD-10-CM

## 2024-03-09 DIAGNOSIS — Z0000 Encounter for general adult medical examination without abnormal findings: Secondary | ICD-10-CM

## 2024-03-19 ENCOUNTER — Ambulatory Visit
Admission: RE | Admit: 2024-03-19 | Discharge: 2024-03-19 | Disposition: A | Source: Ambulatory Visit | Attending: Family

## 2024-03-19 DIAGNOSIS — Z0000 Encounter for general adult medical examination without abnormal findings: Secondary | ICD-10-CM | POA: Diagnosis not present

## 2024-03-19 DIAGNOSIS — Z1231 Encounter for screening mammogram for malignant neoplasm of breast: Secondary | ICD-10-CM | POA: Diagnosis not present

## 2024-04-25 ENCOUNTER — Ambulatory Visit

## 2024-04-25 DIAGNOSIS — Z719 Counseling, unspecified: Secondary | ICD-10-CM

## 2024-04-25 DIAGNOSIS — Z23 Encounter for immunization: Secondary | ICD-10-CM

## 2024-04-25 NOTE — Progress Notes (Signed)
 Patient seen in nurse clinic for MMR vaccine.  Titers dated 12/29/2023 show immunity to Mumps and Rubella but not Rubeola.  Patient's MD recommended patient get MMR.  MMR given and tolerated well. VIS provided. [facility] updated and copy provided.

## 2025-01-01 ENCOUNTER — Encounter: Admitting: Family
